# Patient Record
Sex: Female | Born: 1975 | Hispanic: No | Marital: Married | Smoking: Never smoker
Health system: Southern US, Community
[De-identification: ages and names within clinical notes are randomized; demographics above are authoritative.]

## PROBLEM LIST (undated history)

## (undated) DIAGNOSIS — E785 Hyperlipidemia, unspecified: Secondary | ICD-10-CM

## (undated) DIAGNOSIS — IMO0002 Reserved for concepts with insufficient information to code with codable children: Secondary | ICD-10-CM

## (undated) DIAGNOSIS — D72829 Elevated white blood cell count, unspecified: Secondary | ICD-10-CM

## (undated) DIAGNOSIS — R7989 Other specified abnormal findings of blood chemistry: Secondary | ICD-10-CM

## (undated) HISTORY — PX: WISDOM TOOTH EXTRACTION: SHX21

## (undated) HISTORY — DX: Elevated white blood cell count, unspecified: D72.829

## (undated) HISTORY — PX: TONSILLECTOMY AND ADENOIDECTOMY: SHX28

## (undated) HISTORY — DX: Reserved for concepts with insufficient information to code with codable children: IMO0002

## (undated) HISTORY — DX: Hyperlipidemia, unspecified: E78.5

## (undated) HISTORY — DX: Other specified abnormal findings of blood chemistry: R79.89

---

## 2003-01-02 ENCOUNTER — Other Ambulatory Visit: Admission: RE | Admit: 2003-01-02 | Discharge: 2003-01-02 | Payer: Self-pay | Admitting: Obstetrics and Gynecology

## 2004-01-12 ENCOUNTER — Other Ambulatory Visit: Admission: RE | Admit: 2004-01-12 | Discharge: 2004-01-12 | Payer: Self-pay | Admitting: Obstetrics and Gynecology

## 2004-11-14 DIAGNOSIS — R87619 Unspecified abnormal cytological findings in specimens from cervix uteri: Secondary | ICD-10-CM

## 2004-11-14 DIAGNOSIS — IMO0002 Reserved for concepts with insufficient information to code with codable children: Secondary | ICD-10-CM

## 2004-11-14 HISTORY — DX: Unspecified abnormal cytological findings in specimens from cervix uteri: R87.619

## 2004-11-14 HISTORY — DX: Reserved for concepts with insufficient information to code with codable children: IMO0002

## 2005-03-08 ENCOUNTER — Other Ambulatory Visit: Admission: RE | Admit: 2005-03-08 | Discharge: 2005-03-08 | Payer: Self-pay | Admitting: Obstetrics and Gynecology

## 2005-06-03 HISTORY — PX: CERVICAL BIOPSY  W/ LOOP ELECTRODE EXCISION: SUR135

## 2005-08-26 ENCOUNTER — Other Ambulatory Visit: Admission: RE | Admit: 2005-08-26 | Discharge: 2005-08-26 | Payer: Self-pay | Admitting: Obstetrics and Gynecology

## 2005-12-02 ENCOUNTER — Other Ambulatory Visit: Admission: RE | Admit: 2005-12-02 | Discharge: 2005-12-02 | Payer: Self-pay | Admitting: Obstetrics and Gynecology

## 2006-03-08 ENCOUNTER — Other Ambulatory Visit: Admission: RE | Admit: 2006-03-08 | Discharge: 2006-03-08 | Payer: Self-pay | Admitting: Obstetrics and Gynecology

## 2006-06-09 ENCOUNTER — Other Ambulatory Visit: Admission: RE | Admit: 2006-06-09 | Discharge: 2006-06-09 | Payer: Self-pay | Admitting: Obstetrics and Gynecology

## 2006-10-30 ENCOUNTER — Other Ambulatory Visit: Admission: RE | Admit: 2006-10-30 | Discharge: 2006-10-30 | Payer: Self-pay | Admitting: Obstetrics and Gynecology

## 2007-05-29 ENCOUNTER — Other Ambulatory Visit: Admission: RE | Admit: 2007-05-29 | Discharge: 2007-05-29 | Payer: Self-pay | Admitting: Obstetrics and Gynecology

## 2008-06-13 ENCOUNTER — Other Ambulatory Visit: Admission: RE | Admit: 2008-06-13 | Discharge: 2008-06-13 | Payer: Self-pay | Admitting: Obstetrics and Gynecology

## 2013-03-26 ENCOUNTER — Encounter: Payer: Self-pay | Admitting: Obstetrics and Gynecology

## 2013-07-12 ENCOUNTER — Encounter: Payer: Self-pay | Admitting: Obstetrics and Gynecology

## 2013-07-12 ENCOUNTER — Ambulatory Visit (INDEPENDENT_AMBULATORY_CARE_PROVIDER_SITE_OTHER): Payer: 59 | Admitting: Obstetrics and Gynecology

## 2013-07-12 VITALS — BP 106/66 | HR 81 | Resp 16 | Ht 62.0 in | Wt 154.0 lb

## 2013-07-12 DIAGNOSIS — Z01419 Encounter for gynecological examination (general) (routine) without abnormal findings: Secondary | ICD-10-CM

## 2013-07-12 DIAGNOSIS — Z0000 Encounter for general adult medical examination without abnormal findings: Secondary | ICD-10-CM

## 2013-07-12 LAB — POCT URINALYSIS DIPSTICK
Blood, UA: NEGATIVE
Glucose, UA: NEGATIVE
Ketones, UA: NEGATIVE
Urobilinogen, UA: NEGATIVE

## 2013-07-12 LAB — HEMOGLOBIN, FINGERSTICK: Hemoglobin, fingerstick: 12.6 g/dL (ref 12.0–16.0)

## 2013-07-12 MED ORDER — DROSPIRENONE-ETHINYL ESTRADIOL 3-0.03 MG PO TABS: 1.0000 | ORAL_TABLET | Freq: Every day | ORAL | Status: DC

## 2013-07-12 NOTE — Patient Instructions (Signed)

## 2013-07-12 NOTE — Progress Notes (Signed)
37 y.o.  Married   Caucasian   female   G1P0010   here for annual exam.  Likes OC's and wants to continue.    Patient's last menstrual period was 06/10/2013.          Sexually active: yes  The current method of family planning is OCP (estrogen/progesterone).    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 Diabetes Mother   . Thyroid disease Mother     hypo  . Cancer Father     carcinoma in sinus cavity    There are no active problems to display for this patient.   Past Medical History  Diagnosis Date  . 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 Cervical biopsy  w/ loop electrode excision  06/03/05    HGSIL CIN II/CINIII    Allergies: Penicillins  Current Outpatient Prescriptions  Medication Sig Dispense Refill  . Calcium-Magnesium-Vitamin D (CALCIUM 500 PO) Take by mouth daily.      . Multiple Vitamin (MULTI-VITAMIN DAILY PO) Take by mouth daily.      Marland Kitchen YASMIN 28 3-0.03 MG tablet        No current facility-administered medications for this visit.    ROS: Pertinent items are noted in HPI.  Social Hx:  Married, no children, works as a Transport planner  Exam:    BP 106/66  Pulse 81  Resp 16  Ht 5\' 2"  (1.575 m)  Wt 154 lb (69.854 kg)  BMI 28.16 kg/m2  LMP 07/28/2014GHt stable and Wt down 3 pounds from last yr   Wt Readings from Last 3 Encounters:  07/12/13 154 lb (69.854 kg)     Ht Readings from Last 3 Encounters:  07/12/13 5\' 2"  (1.575 m)    General appearance: alert, cooperative and appears stated age Head: Normocephalic, without  obvious abnormality, atraumatic Neck: no adenopathy, supple, symmetrical, trachea midline and thyroid not enlarged, symmetric, no tenderness/mass/nodules Lungs: clear to auscultation bilaterally Breasts: Inspection negative, No nipple retraction or dimpling, No nipple discharge or bleeding, No axillary or supraclavicular adenopathy, Normal to palpation without dominant masses Heart: regular rate and rhythm Abdomen: soft, non-tender; bowel sounds normal; no masses,  no organomegaly Extremities: extremities normal, atraumatic, no cyanosis or edema Skin: Skin color, texture, turgor normal. No rashes or lesions Lymph nodes: Cervical, supraclavicular, and axillary nodes normal. No abnormal inguinal nodes palpated Neurologic: Grossly normal   Pelvic: External genitalia:  no lesions              Urethra:  normal appearing urethra with no masses, tenderness or lesions              Bartholins and Skenes: normal                 Vagina: normal appearing vagina with normal color and discharge, no lesions              Cervix: normal appearance  Pap taken: yes        Bimanual Exam:  Uterus:  uterus is normal size, shape, consistency and nontender, af, mobile                                      Adnexa: normal adnexa in size, nontender and no masses                                      Rectovaginal: Confirms                                      Anus:  normal sphincter tone, no lesions  A: normal gyn exam, OC's     CIN 3 with + endocx margin 2006, nl paps since     P:     mammogram pap smear counseled on breast self exam, use and side effects of OCP's, adequate intake of calcium and vitamin D, diet and exercise return annually or prn   Discussed risk of DVT with yasmin - pt has tried others and didn't like them so elects to cont Yasmin.   An After Visit Summary was printed and given to the patient.

## 2013-07-17 ENCOUNTER — Ambulatory Visit: Payer: Self-pay | Admitting: Obstetrics and Gynecology

## 2013-07-17 LAB — IPS PAP TEST WITH HPV

## 2013-07-24 ENCOUNTER — Other Ambulatory Visit: Payer: Self-pay | Admitting: Obstetrics and Gynecology

## 2013-07-24 ENCOUNTER — Ambulatory Visit: Payer: Self-pay | Admitting: Obstetrics and Gynecology

## 2014-07-16 ENCOUNTER — Other Ambulatory Visit: Payer: Self-pay | Admitting: Obstetrics and Gynecology

## 2014-07-16 ENCOUNTER — Ambulatory Visit (INDEPENDENT_AMBULATORY_CARE_PROVIDER_SITE_OTHER): Payer: 59 | Admitting: Obstetrics and Gynecology

## 2014-07-16 ENCOUNTER — Encounter: Payer: Self-pay | Admitting: Obstetrics and Gynecology

## 2014-07-16 VITALS — BP 96/70 | HR 72 | Resp 18 | Ht 62.5 in | Wt 141.0 lb

## 2014-07-16 DIAGNOSIS — D259 Leiomyoma of uterus, unspecified: Secondary | ICD-10-CM | POA: Insufficient documentation

## 2014-07-16 DIAGNOSIS — Z01419 Encounter for gynecological examination (general) (routine) without abnormal findings: Secondary | ICD-10-CM

## 2014-07-16 DIAGNOSIS — Z0000 Encounter for general adult medical examination without abnormal findings: Secondary | ICD-10-CM

## 2014-07-16 DIAGNOSIS — N852 Hypertrophy of uterus: Secondary | ICD-10-CM

## 2014-07-16 LAB — CBC
HEMATOCRIT: 39.3 % (ref 36.0–46.0)
HEMOGLOBIN: 13.2 g/dL (ref 12.0–15.0)
MCH: 29.5 pg (ref 26.0–34.0)
MCHC: 33.6 g/dL (ref 30.0–36.0)
MCV: 87.7 fL (ref 78.0–100.0)
Platelets: 310 10*3/uL (ref 150–400)
RBC: 4.48 MIL/uL (ref 3.87–5.11)
RDW: 14.2 % (ref 11.5–15.5)
WBC: 9.1 10*3/uL (ref 4.0–10.5)

## 2014-07-16 LAB — POCT URINALYSIS DIPSTICK
Bilirubin, UA: NEGATIVE
Blood, UA: NEGATIVE
Glucose, UA: NEGATIVE
KETONES UA: NEGATIVE
Leukocytes, UA: NEGATIVE
Nitrite, UA: NEGATIVE
PH UA: 6
PROTEIN UA: NEGATIVE
Urobilinogen, UA: NEGATIVE

## 2014-07-16 LAB — POCT URINE PREGNANCY: PREG TEST UR: NEGATIVE

## 2014-07-16 LAB — HEMOGLOBIN, FINGERSTICK: Hemoglobin, fingerstick: 13.1 g/dL (ref 12.0–16.0)

## 2014-07-16 MED ORDER — DROSPIRENONE-ETHINYL ESTRADIOL 3-0.03 MG PO TABS: 1.0000 | ORAL_TABLET | Freq: Every day | ORAL | Status: DC

## 2014-07-16 NOTE — Patient Instructions (Addendum)
EXERCISE AND DIET:  We recommended that you start or continue a regular exercise program for good health. Regular exercise means any activity that makes your heart beat faster and makes you sweat.  We recommend exercising at least 30 minutes per day at least 3 days a week, preferably 4 or 5.  We also recommend a diet low in fat and sugar.  Inactivity, poor dietary choices and obesity can cause diabetes, heart attack, stroke, and kidney damage, among others.    ALCOHOL AND SMOKING:  Women should limit their alcohol intake to no more than 7 drinks/beers/glasses of wine (combined, not each!) per week. Moderation of alcohol intake to this level decreases your risk of breast cancer and liver damage. And of course, no recreational drugs are part of a healthy lifestyle.  And absolutely no smoking or even second hand smoke. Most people know smoking can cause heart and lung diseases, but did you know it also contributes to weakening of your bones? Aging of your skin?  Yellowing of your teeth and nails?  CALCIUM AND VITAMIN D:  Adequate intake of calcium and Vitamin D are recommended.  The recommendations for exact amounts of these supplements seem to change often, but generally speaking 600 mg of calcium (either carbonate or citrate) and 800 units of Vitamin D per day seems prudent. Certain women may benefit from higher intake of Vitamin D.  If you are among these women, your doctor will have told you during your visit.    PAP SMEARS:  Pap smears, to check for cervical cancer or precancers,  have traditionally been done yearly, although recent scientific advances have shown that most women can have pap smears less often.  However, every woman still should have a physical exam from her gynecologist every year. It will include a breast check, inspection of the vulva and vagina to check for abnormal growths or skin changes, a visual exam of the cervix, and then an exam to evaluate the size and shape of the uterus and  ovaries.  And after 40 years of age, a rectal exam is indicated to check for rectal cancers. We will also provide age appropriate advice regarding health maintenance, like when you should have certain vaccines, screening for sexually transmitted diseases, bone density testing, colonoscopy, mammograms, etc.   MAMMOGRAMS:  All women over 40 years old should have a yearly mammogram. Many facilities now offer a "3D" mammogram, which may cost around $50 extra out of pocket. If possible,  we recommend you accept the option to have the 3D mammogram performed.  It both reduces the number of women who will be called back for extra views which then turn out to be normal, and it is better than the routine mammogram at detecting truly abnormal areas.    COLONOSCOPY:  Colonoscopy to screen for colon cancer is recommended for all women at age 50.  We know, you hate the idea of the prep.  We agree, BUT, having colon cancer and not knowing it is worse!!  Colon cancer so often starts as a polyp that can be seen and removed at colonscopy, which can quite literally save your life!  And if your first colonoscopy is normal and you have no family history of colon cancer, most women don't have to have it again for 10 years.  Once every ten years, you can do something that may end up saving your life, right?  We will be happy to help you get it scheduled when you are ready.    Be sure to check your insurance coverage so you understand how much it will cost.  It may be covered as a preventative service at no cost, but you should check your particular policy.     Fibroids Fibroids are lumps (tumors) that can occur any place in a woman's body. These lumps are not cancerous. Fibroids vary in size, weight, and where they grow. HOME CARE  Do not take aspirin.  Write down the number of pads or tampons you use during your period. Tell your doctor. This can help determine the best treatment for you. GET HELP RIGHT AWAY IF:  You have  pain in your lower belly (abdomen) that is not helped with medicine.  You have cramps that are not helped with medicine.  You have more bleeding between or during your period.  You feel lightheaded or pass out (faint).  Your lower belly pain gets worse. MAKE SURE YOU:  Understand these instructions.  Will watch your condition.  Will get help right away if you are not doing well or get worse. Document Released: 12/03/2010 Document Revised: 01/23/2012 Document Reviewed: 12/03/2010 [facility] Patient Information 2015 [place], [place]. This information is not intended to replace advice given to you by your health care provider. Make sure you discuss any questions you have with your health care provider.

## 2014-07-16 NOTE — Progress Notes (Signed)
GYNECOLOGY VISIT  PCP: No PCP  Referring provider:   HPI: 38 y.o.   Unknown  Caucasian  female   G1P0010 with Patient's last menstrual period was 06/30/2014.   here for   Annual Gynecological Examination    Uses Yasmin name brand.  Menses normal on Yasmin.  Had heavy menses prior to this.   Working with a Clinical research associate to loose weight.   Hgb:  13.1 Urine: Neg   GYNECOLOGIC HISTORY: Patient's last menstrual period was 06/30/2014. Sexually active:  yes Partner preference: Female Contraception:   OCP - Yasmin Menopausal hormone therapy: None DES exposure:  None   Blood transfusions: No   Sexually transmitted diseases:  HPV+  GYN procedures and prior surgeries: LEEP - CIN 3 2006 Last mammogram: N/A                Last pap and high risk HPV testing: 06/2013 Neg. HR HPV neg   History of abnormal pap smear:  Yes, HSIL (ASC-H) +HPV 2006   OB History   Grav Para Term Preterm Abortions TAB SAB Ect Mult Living   1 0   1     0       LIFESTYLE: Exercise:  Yes, cardio, weights daily              OTHER HEALTH MAINTENANCE: Tetanus/TDap: 2004- Pt denies one today HPV: No Influenza: No    Bone density: No Colonoscopy: No  Cholesterol check: 2009  Family History  Problem Relation Age of Onset  . Diabetes Mother   . Thyroid disease Mother     hypo  . Cancer Father     carcinoma in sinus cavity    There are no active problems to display for this patient.  Past Medical History  Diagnosis Date  . 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 Cervical biopsy  w/ loop electrode excision  06/03/05    HGSIL CIN II/CINIII    ALLERGIES: Penicillins  Current Outpatient Prescriptions  Medication Sig Dispense Refill  . Calcium-Magnesium-Vitamin D (CALCIUM 500 PO) Take by mouth daily.      . drospirenone-ethinyl estradiol (YASMIN 28) 3-0.03 MG tablet Take 1 tablet by mouth daily.  1 Package  12  . Multiple Vitamin  (MULTI-VITAMIN DAILY PO) Take by mouth daily.       No current facility-administered medications for this visit.     ROS:  Pertinent items are noted in HPI.  History   Social History  . Marital Status: Unknown    Spouse Name: N/A    Number of Children: N/A  . Years of Education: N/A   Occupational History  . Not on file.   Social History Main Topics  . Smoking status: Never Smoker   . Smokeless tobacco: Never Used  . Alcohol Use: 1.2 oz/week    2 Cans of beer per week  . Drug Use: No  . Sexual Activity: Yes    Partners: Male    Birth Control/ Protection: Pill     Comment: Yasmin   Other Topics Concern  . Not on file   Social History Narrative  . No narrative on file    PHYSICAL EXAMINATION:    BP 96/70  Pulse 72  Resp 18  Ht 5' 2.5" (1.588 m)  Wt 141 lb (63.957 kg)  BMI 25.36 kg/m2  LMP 06/30/2014   Wt Readings from Last 3 Encounters:  07/16/14  141 lb (63.957 kg)  07/12/13 154 lb (69.854 kg)     Ht Readings from Last 3 Encounters:  07/16/14 5' 2.5" (1.588 m)  07/12/13 5\' 2"  (1.575 m)    General appearance: alert, cooperative and appears stated age Head: Normocephalic, without obvious abnormality, atraumatic Neck: no adenopathy, supple, symmetrical, trachea midline and thyroid not enlarged, symmetric, no tenderness/mass/nodules Lungs: clear to auscultation bilaterally Breasts: Inspection negative, No nipple retraction or dimpling, No nipple discharge or bleeding, No axillary or supraclavicular adenopathy, Normal to palpation without dominant masses Heart: regular rate and rhythm Abdomen: soft, non-tender; no masses,  no organomegaly Extremities: extremities normal, atraumatic, no cyanosis or edema Skin: Skin color, texture, turgor normal. No rashes or lesions Lymph nodes: Cervical, supraclavicular, and axillary nodes normal. No abnormal inguinal nodes palpated Neurologic: Grossly normal  Pelvic: External genitalia:  no lesions              Urethra:   normal appearing urethra with no masses, tenderness or lesions              Bartholins and Skenes: normal                 Vagina: normal appearing vagina with normal color and discharge, no lesions              Cervix: normal appearance              Pap and high risk HPV testing done: Yes.          Bimanual Exam:  Uterus:   14 week size and nontender.  Uterus feels broad based.                                       Adnexa: normal adnexa in size, nontender and no masses                                      Rectovaginal:  Yes.                                        Confirms above.                                       Anus:  normal sphincter tone, no lesions  UPT negative  ASSESSMENT  Normal gynecologic exam. Uterine enlargement.  Fibroids on exam.  History of LEEP for CIN 3.  PLAN  Mammogram recommended yearly starting at age 72. Pap smear and high risk HPV testing as above. Counseled on self breast exam, Calcium and vitamin D intake, exercise. See lab orders: Yes.  Routine labs. Preliminary discussion regarding fibroids.  Written information regarding fibroids as well. Return for pelvic ultrasound.  Refill on OCPs - Yasmin for 12 months.  Return annually or prn   An After Visit Summary was printed and given to the patient.

## 2014-07-17 ENCOUNTER — Telehealth: Payer: Self-pay | Admitting: Obstetrics and Gynecology

## 2014-07-17 LAB — LIPID PANEL
CHOL/HDL RATIO: 3.5 ratio
Cholesterol: 150 mg/dL (ref 0–200)
HDL: 43 mg/dL (ref >39)
LDL CALC: 72 mg/dL (ref 0–99)
Triglycerides: 173 mg/dL — ABNORMAL HIGH (ref <150)
VLDL: 35 mg/dL (ref 0–40)

## 2014-07-17 LAB — COMPREHENSIVE METABOLIC PANEL
ALT: 16 U/L (ref 0–35)
AST: 15 U/L (ref 0–37)
Albumin: 4.2 g/dL (ref 3.5–5.2)
Alkaline Phosphatase: 71 U/L (ref 39–117)
BUN: 10 mg/dL (ref 6–23)
CHLORIDE: 103 meq/L (ref 96–112)
CO2: 26 meq/L (ref 19–32)
CREATININE: 0.57 mg/dL (ref 0.50–1.10)
Calcium: 9.2 mg/dL (ref 8.4–10.5)
Glucose, Bld: 106 mg/dL — ABNORMAL HIGH (ref 70–99)
Potassium: 3.7 mEq/L (ref 3.5–5.3)
Sodium: 137 mEq/L (ref 135–145)
Total Bilirubin: 0.3 mg/dL (ref 0.2–1.2)
Total Protein: 6.8 g/dL (ref 6.0–8.3)

## 2014-07-17 LAB — TSH: TSH: 4.512 u[IU]/mL — ABNORMAL HIGH (ref 0.350–4.500)

## 2014-07-17 NOTE — Telephone Encounter (Signed)
Patient called to check on pre-cert for PUS. Advised patient that per benefit quote received, she will be responsible for $465.96 when she comes in for PUS. Patient agreeable.  Scheduled PUS. Advised patient of 72 hour cancellation policy and $161 cancellation fee. Patient agreeable.

## 2014-07-18 LAB — THYROID PROFILE - CHCC
FREE THYROXINE INDEX: 2.2 (ref 1.4–3.8)
T3 Uptake: 21 % — ABNORMAL LOW (ref 22.0–35.0)
T4, Total: 10.6 ug/dL (ref 4.5–12.0)

## 2014-07-22 ENCOUNTER — Other Ambulatory Visit: Payer: Self-pay | Admitting: Obstetrics and Gynecology

## 2014-07-22 DIAGNOSIS — R7989 Other specified abnormal findings of blood chemistry: Secondary | ICD-10-CM

## 2014-07-23 LAB — IPS PAP TEST WITH HPV

## 2014-07-24 ENCOUNTER — Other Ambulatory Visit: Payer: Self-pay | Admitting: Obstetrics and Gynecology

## 2014-07-24 ENCOUNTER — Encounter: Payer: Self-pay | Admitting: Obstetrics and Gynecology

## 2014-07-24 ENCOUNTER — Ambulatory Visit (INDEPENDENT_AMBULATORY_CARE_PROVIDER_SITE_OTHER): Payer: 59 | Admitting: Obstetrics and Gynecology

## 2014-07-24 ENCOUNTER — Ambulatory Visit (INDEPENDENT_AMBULATORY_CARE_PROVIDER_SITE_OTHER): Payer: 59

## 2014-07-24 VITALS — BP 120/82 | HR 72 | Wt 139.0 lb

## 2014-07-24 DIAGNOSIS — N852 Hypertrophy of uterus: Secondary | ICD-10-CM

## 2014-07-24 DIAGNOSIS — D251 Intramural leiomyoma of uterus: Secondary | ICD-10-CM

## 2014-07-24 NOTE — Patient Instructions (Addendum)
Hysterectomy Information  A hysterectomy is a surgery in which your uterus is removed. This surgery may be done to treat various medical problems. After the surgery, you will no longer have menstrual periods. The surgery will also make you unable to become pregnant (sterile). The fallopian tubes and ovaries can be removed (bilateral salpingo-oophorectomy) during this surgery as well.  REASONS FOR A HYSTERECTOMY  Persistent, abnormal bleeding.  Lasting (chronic) pelvic pain or infection.  The lining of the uterus (endometrium) starts growing outside the uterus (endometriosis).  The endometrium starts growing in the muscle of the uterus (adenomyosis).  The uterus falls down into the vagina (pelvic organ prolapse).  Noncancerous growths in the uterus (uterine fibroids) that cause symptoms.  Precancerous cells.  Cervical cancer or uterine cancer. TYPES OF HYSTERECTOMIES  Supracervical hysterectomy--In this type, the top part of the uterus is removed, but not the cervix.  Total hysterectomy--The uterus and cervix are removed.  Radical hysterectomy--The uterus, the cervix, and the fibrous tissue that holds the uterus in place in the pelvis (parametrium) are removed. WAYS A HYSTERECTOMY CAN BE PERFORMED  Abdominal hysterectomy--A large surgical cut (incision) is made in the abdomen. The uterus is removed through this incision.  Vaginal hysterectomy--An incision is made in the vagina. The uterus is removed through this incision. There are no abdominal incisions.  Conventional laparoscopic hysterectomy--Three or four small incisions are made in the abdomen. A thin, lighted tube with a camera (laparoscope) is inserted into one of the incisions. Other tools are put through the other incisions. The uterus is cut into small pieces. The small pieces are removed through the incisions, or they are removed through the vagina.  Laparoscopically assisted vaginal hysterectomy (LAVH)--Three or four  small incisions are made in the abdomen. Part of the surgery is performed laparoscopically and part vaginally. The uterus is removed through the vagina.  Robot-assisted laparoscopic hysterectomy--A laparoscope and other tools are inserted into 3 or 4 small incisions in the abdomen. A computer-controlled device is used to give the surgeon a 3D image and to help control the surgical instruments. This allows for more precise movements of surgical instruments. The uterus is cut into small pieces and removed through the incisions or removed through the vagina. RISKS AND COMPLICATIONS  Possible complications associated with this procedure include:  Bleeding and risk of blood transfusion. Tell your health care provider if you do not want to receive any blood products.  Blood clots in the legs or lung.  Infection.  Injury to surrounding organs.  Problems or side effects related to anesthesia.  Conversion to an abdominal hysterectomy from one of the other techniques. WHAT TO EXPECT AFTER A HYSTERECTOMY  You will be given pain medicine.  You will need to have someone with you for the first 3-5 days after you go home.  You will need to follow up with your surgeon in 2-4 weeks after surgery to evaluate your progress.  You may have early menopause symptoms such as hot flashes, night sweats, and insomnia.  If you had a hysterectomy for a problem that was not cancer or not a condition that could lead to cancer, then you no longer need Pap tests. However, even if you no longer need a Pap test, a regular exam is a good idea to make sure no other problems are starting. Document Released: 04/26/2001 Document Revised: 08/21/2013 Document Reviewed: 07/08/2013 [facility] Patient Information 2015 [place], [place]. This information is not intended to replace advice given to you by your health care  provider. Make sure you discuss any questions you have with your health care provider. Fibroids Fibroids are lumps  (tumors) that can occur any place in a woman's body. These lumps are not cancerous. Fibroids vary in size, weight, and where they grow. HOME CARE  Do not take aspirin.  Write down the number of pads or tampons you use during your period. Tell your doctor. This can help determine the best treatment for you. GET HELP RIGHT AWAY IF:  You have pain in your lower belly (abdomen) that is not helped with medicine.  You have cramps that are not helped with medicine.  You have more bleeding between or during your period.  You feel lightheaded or pass out (faint).  Your lower belly pain gets worse. MAKE SURE YOU:  Understand these instructions.  Will watch your condition.  Will get help right away if you are not doing well or get worse. Document Released: 12/03/2010 Document Revised: 01/23/2012 Document Reviewed: 12/03/2010 [employer] Patient Information 2015 [employer]. This information is not intended to replace advice given to you by your health care provider. Make sure you discuss any questions you have with your health care provider.  

## 2014-07-24 NOTE — Progress Notes (Signed)
Subjective  Patient is her for pelvic ultrasound due to enlarged uterus noted on routine examination.  Husband present for the discussion today.   WIth jumping feels urinary urgency.  Menses are generally heavy but does not interfere with her life.  On OCPs.   Declines future childbearing.   History of LEEP for cervical dysplasia.  Recent pap normal.   Hgb 13.2 at last visit.  TSH 5.12, T4 10.6.  Objective  See ultrasound report and images - reviewed with patient. Uterus with solitary 8.4 cm anterior intramural fibroid.   EMS 6.11 mm and distorted by the fibroid.  Ovaries normal.  No free fluid.     Assessment  Large uterine fibroid.   Plan  Discussion about uterine fibroids - life history, small risk of sarcomatous cells, and treatment options.  I discussed continuing OCPs and follow up ultrasound in 3 months, uterine artery embolization, and laparoscopic hysterectomy with morcellation versus total abdominal hysterectomy - both accompanied by bilateral salpingectomy.  I discussed Depo Lupron as a mechanism to shrink fibroids prior to proceeding with laparoscopic hysterectomy.  Literature provided for the patient about hysterectomy, which is the treatment that she is most considering at this time.  She may be interested to proceed before the end of this year.   25 minutes face to face time of which over 50% was spent in counseling.   After visit summary to patient.    I provided literature for the patient

## 2014-07-25 ENCOUNTER — Telehealth: Payer: Self-pay | Admitting: Obstetrics and Gynecology

## 2014-07-25 NOTE — Telephone Encounter (Signed)
Return call to Sabrina. °

## 2014-07-25 NOTE — Telephone Encounter (Signed)
Spoke with patient. Advised that per benefit quote received, she will be responsible for $2572.18 for the surgeons portion of her surgery. Advised that per our office policy, this payment will be due in full at least 2 weeks prior to her scheduled surgery date. Patient agreeable. Patient asked about hospital charges. I advised that she can contact the pre-services center at [phone] with cpt code [phone] and ask for a quote. Patient agreeable and hopes to schedule mid-late October.

## 2014-07-25 NOTE — Telephone Encounter (Signed)
Left message for patient to call back. Need to go over surgery benefits. °

## 2014-07-28 NOTE — Telephone Encounter (Signed)
VM received from [place] at [facility] [phone] requesting clinical info for precert within 2 hours. Call back to [facility], advised this is an unrealistic time expectation since this form must be completed by MD and we will send as soon as we are able to complete.

## 2014-07-29 ENCOUNTER — Other Ambulatory Visit: Payer: Self-pay

## 2014-07-29 NOTE — Telephone Encounter (Signed)
Last AEX: 07/16/14 Last refill:07/16/14 ! Pack X 11 Current AEX:07/24/15  Pt was given new rx on 07/16/14

## 2014-07-29 NOTE — Telephone Encounter (Signed)
Ref # [id number]   [facility] callin sabrina in regards to surgery precert.

## 2014-07-31 ENCOUNTER — Telehealth: Payer: Self-pay | Admitting: Obstetrics and Gynecology

## 2014-07-31 NOTE — Telephone Encounter (Signed)
Call to patient. Given update that insurance company requires precert for surgery and the documentation has now been completed and submitted.   Discussed date options. Patient planning on November due to husbands work schedule. Discussed available dates in November and advised surgery dates fill quickly at end of the year. Patient to consider options for dates and weigh benefit of waiting for new insurance plan that begins on 10-14-14. She will call me back once she has made decision.  Routing to provider for final review. Patient agreeable to disposition. Will close encounter

## 2014-07-31 NOTE — Telephone Encounter (Signed)
See previous phone note. Patient requests surgery date of 10-21-14. Advised will need to call with new insurance info as soon as available so that new precert can be initiated. Will call her back once surgery posted. Case request sent to hospital.

## 2014-07-31 NOTE — Telephone Encounter (Signed)
Per Glennon Hamilton said to open a new phone note for this patient as the previous one was closed in error.

## 2014-08-08 NOTE — Telephone Encounter (Signed)
Case is posted for 10-21-14 as patient requested pending new insurance information and precertification.  Routing to provider for final review. Patient agreeable to disposition. Will close encounter

## 2014-08-18 ENCOUNTER — Telehealth: Payer: Self-pay | Admitting: Obstetrics and Gynecology

## 2014-08-18 NOTE — Telephone Encounter (Signed)
Nichole Mcgee, routing this to you for records and precert information

## 2014-08-18 NOTE — Telephone Encounter (Signed)
Pt says she received a letter from her insurance company requesting records before she schedule surgery.

## 2014-08-18 NOTE — Telephone Encounter (Signed)
Returned call to patient. Patient states that the letter she received is from [facility] and is dated 09.15.2015 requesting records for a surgery that she is considering. I advised patient that we sent records to [facility] on 09.17.2015.  Also advised patient that in order to prevent having to reschedule her surgery, we will need to have her new insurance plan information no later than Sep 29, 2014. I explained that pre-payment for surgery is required at least 2 weeks prior to the scheduled surgery date and that we need her new coverage information in a timely manner so that we can complete pre-cert process and provide her with information regarding her out of pocket expectation.   Patient states that she will speak with her employer and will call back with details regarding her new plan ASAP.

## 2014-08-21 NOTE — Telephone Encounter (Signed)
Call to patient. Patient states that she will be renewing policy with the same insurance company. Benefits should remain the same.

## 2014-08-21 NOTE — Telephone Encounter (Signed)
Pt states she is returning a call to sabrina

## 2014-08-22 ENCOUNTER — Telehealth: Payer: Self-pay | Admitting: Obstetrics and Gynecology

## 2014-08-22 NOTE — Telephone Encounter (Signed)
Per Eddie Dibbles, we cannot initiate another request for the patients surgery because the original request for patients surgery had been denied due to not having clinical data. He confirmed that the clinical data has now been received, but that we must go through the appeal process in order to have the surgery authorized for date of service 12.08.2015. [id number]

## 2014-08-27 NOTE — Telephone Encounter (Signed)
Letter written by administration and sent to insurance company on 08-26-14. (reviewed by Dr Sabra Heck while Dr Quincy Simmonds out of office).  Routing to provider for final review. Patient agreeable to disposition. Will close encounter

## 2014-09-03 ENCOUNTER — Other Ambulatory Visit (INDEPENDENT_AMBULATORY_CARE_PROVIDER_SITE_OTHER): Payer: 59

## 2014-09-03 DIAGNOSIS — R7989 Other specified abnormal findings of blood chemistry: Secondary | ICD-10-CM

## 2014-09-03 DIAGNOSIS — R946 Abnormal results of thyroid function studies: Secondary | ICD-10-CM

## 2014-09-03 LAB — THYROID PANEL WITH TSH
Free Thyroxine Index: 2.4 (ref 1.4–3.8)
T3 Uptake: 27 % (ref 22.0–35.0)
T4, Total: 8.9 ug/dL (ref 4.5–12.0)
TSH: 1.898 u[IU]/mL (ref 0.350–4.500)

## 2014-09-15 ENCOUNTER — Encounter: Payer: Self-pay | Admitting: Obstetrics and Gynecology

## 2014-09-23 ENCOUNTER — Telehealth: Payer: Self-pay | Admitting: *Deleted

## 2014-09-23 NOTE — Telephone Encounter (Signed)
Call to patient. Surgery date 10-21-14 at 730 at [facility] for TAH/ Bilateral Salpingectomy. Surgery instruction sheet reviewed with patient and printed copy mailed. All surgical appointments scheduled. Will she need bowel prep?

## 2014-09-23 NOTE — Telephone Encounter (Signed)
I recommend doing a Magnesium Citrate bowel prep the day before surgery and starting this at noon. Clear liquids only after starts bowel prep.  Thanks.

## 2014-09-23 NOTE — Telephone Encounter (Signed)
Patient notified and instructions given.  Encounter closed.

## 2014-09-29 ENCOUNTER — Ambulatory Visit (INDEPENDENT_AMBULATORY_CARE_PROVIDER_SITE_OTHER): Payer: 59 | Admitting: Obstetrics and Gynecology

## 2014-09-29 ENCOUNTER — Telehealth: Payer: Self-pay | Admitting: Obstetrics and Gynecology

## 2014-09-29 ENCOUNTER — Encounter: Payer: Self-pay | Admitting: Obstetrics and Gynecology

## 2014-09-29 VITALS — BP 110/70 | HR 70 | Ht 62.5 in | Wt 142.8 lb

## 2014-09-29 DIAGNOSIS — D259 Leiomyoma of uterus, unspecified: Secondary | ICD-10-CM

## 2014-09-29 NOTE — Patient Instructions (Signed)
Please take the bottle of magnesium citrate the day before surgery, starting at 12:00 noon.  You may drink only clear liquids after that time.

## 2014-09-29 NOTE — Progress Notes (Signed)
Patient ID: Nichole Mcgee, female   DOB: 1976-08-06, 38 y.o.   MRN: [id number] GYNECOLOGY  VISIT   HPI: 38 y.o.   Married  Caucasian  female   G1P0010 with Patient's last menstrual period was 09/15/2014 (approximate).   here to discuss surgery.   Planning on hysterectomy on 10/21/14 for uterine fibroid.  Currently on Yasmin.  No history of painful menstruation prior to taking OCPs. Declines future childbearing.   History of LEEP for cervical dysplasia.  Recent pap normal.   Labs 07/16/14:  Hgb 13.2. TSH 5.12, T4 10.6.  Ultrasound 07/24/14 -  Uterus with solitary 8.4 cm anterior intramural fibroid.  EMS 6.11 mm and distorted by the fibroid.  Ovaries normal.  No free fluid.   GYNECOLOGIC HISTORY: Patient's last menstrual period was 09/15/2014 (approximate). Contraception:  OCP's--Yasmin  Menopausal hormone therapy: n/a Sexually transmitted diseases: HPV+  GYN procedures and prior surgeries: LEEP - CIN 3 2006 DES exposure: None  Blood transfusions: No  Last mammogram: N/A  Last pap and high risk HPV testing: 07/16/14 - Neg. HR HPV neg  History of abnormal pap smear: Yes, HSIL (ASC-H) +HPV 2006   OB History    Gravida Para Term Preterm AB TAB SAB Ectopic Multiple Living   1 0   1     0         Patient Active Problem List   Diagnosis Date Noted  . Uterine fibroid 07/16/2014    Past Medical History  Diagnosis Date  . 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 Cervical biopsy  w/ loop electrode excision  06/03/05    HGSIL CIN II/CINIII  . Wisdom tooth extraction      Current Outpatient Prescriptions  Medication Sig Dispense Refill  . Calcium-Magnesium-Vitamin D (CALCIUM 500 PO) Take by mouth daily.    . drospirenone-ethinyl estradiol (YASMIN 28) 3-0.03 MG tablet Take 1 tablet by mouth daily. 1 Package 11  . Multiple Vitamin (MULTI-VITAMIN DAILY PO) Take by mouth daily.     No  current facility-administered medications for this visit.     ALLERGIES: Penicillins  Family History  Problem Relation Age of Onset  . Diabetes Mother     AODM  . Thyroid disease Mother     hypo  . Cancer Father     carcinoma in sinus cavity  . Diabetes Maternal Grandmother     AODM    History   Social History  . Marital Status: Unknown    Spouse Name: N/A    Number of Children: N/A  . Years of Education: N/A   Occupational History  . Not on file.   Social History Main Topics  . Smoking status: Never Smoker   . Smokeless tobacco: Never Used  . Alcohol Use: 1.2 oz/week    2 Cans of beer per week  . Drug Use: No  . Sexual Activity:    Partners: Male    Birth Control/ Protection: Pill     Comment: Yasmin   Other Topics Concern  . Not on file   Social History Narrative    ROS:  Pertinent items are noted in HPI.  PHYSICAL EXAMINATION:    BP 110/70 mmHg  Pulse 70  Ht 5' 2.5" (1.588 m)  Wt 142 lb 12.8 oz (64.774 kg)  BMI 25.69 kg/m2  LMP 09/15/2014 (Approximate)     General appearance: alert, cooperative and appears stated age Lungs: clear  to auscultation bilaterally Heart: regular rate and rhythm Abdomen: uterus to umbilicus - 2 cm, soft, non-tender.  No abnormal inguinal nodes palpated  Pelvic: External genitalia:  no lesions              Urethra:  normal appearing urethra with no masses, tenderness or lesions              Bartholins and Skenes: normal                 Vagina: normal appearing vagina with normal color and discharge, no lesions              Cervix: consistent with prior LEEP.                   Bimanual Exam:  Uterus:  uterus is 16 week size and out of the pelvis, nontender                                      Adnexa: normal adnexa in size, nontender and no masses                                       ASSESSMENT  Uterine fibroid.  Enlarging.  Declines future childbearing.  History of LEEP.  Last pap normal.   PLAN  Plan is for  total abdominal hysterectomy with bilateral salpingectomy, possible bilateral salpingo-oophorectomy if endometriosis or abnormality detected.  Reviewed benefits, risks, and alternatives to surgery.  Risks include but are not limited to bleeding, infection, damage to surrounding organs, reaction to anesthesia, pneumonia, hernias, neuropathies, DVT, PE, death, and need for reoperation.  Patient wishes to proceed.  She will do a magnesium citrate bowel preparation.  Can continue OCPs until after surgery is done.     An After Visit Summary was printed and given to the patient.  __25____ minutes face to face time of which over 50% was spent in counseling.

## 2014-09-29 NOTE — Telephone Encounter (Signed)
MAILED SURGERY PAYMENT LETTER  September 29, 2014   Dear Ms. Nichole Mcgee,  Your surgery is scheduled for October 21, 2014.  Upon requesting authorization for your surgery, your insurance company has informed us that they will cover 80% of the charges after a $2500.00 deductible, and you will be responsible to pay approximately $2572.18_.  It is our office policy that this amount is paid in full two weeks prior to your surgery. Your payment is due October 07, 2014.  If there is a balance due after your insurance company pays their portion, we will send you a bill.  If there is a refund due to you, we will send you a check within one month.  Payment may be made by cash, check, Visa, MasterCard or American Express.  If payment is not made two weeks prior to your surgery, we will have to reschedule your surgery.  The above fee includes only our fee for the surgery and does not include charges you may have from the facility, anesthesia or pathology.  If you have any questions, please call us at [phone].

## 2014-10-13 NOTE — Telephone Encounter (Signed)
Pt  Wants to talk with Janett Billow about a payment she made.

## 2014-10-15 ENCOUNTER — Encounter (HOSPITAL_COMMUNITY)
Admission: RE | Admit: 2014-10-15 | Discharge: 2014-10-15 | Disposition: A | Discharge status: Home / Self Care | Payer: 59 | Source: Ambulatory Visit | Attending: Obstetrics and Gynecology | Admitting: Obstetrics and Gynecology

## 2014-10-15 ENCOUNTER — Encounter (HOSPITAL_COMMUNITY): Payer: Self-pay

## 2014-10-15 DIAGNOSIS — Z01812 Encounter for preprocedural laboratory examination: Secondary | ICD-10-CM | POA: Insufficient documentation

## 2014-10-15 LAB — CBC
HEMATOCRIT: 39 % (ref 36.0–46.0)
Hemoglobin: 13.9 g/dL (ref 12.0–15.0)
MCH: 30.8 pg (ref 26.0–34.0)
MCHC: 35.6 g/dL (ref 30.0–36.0)
MCV: 86.3 fL (ref 78.0–100.0)
PLATELETS: 319 10*3/uL (ref 150–400)
RBC: 4.52 MIL/uL (ref 3.87–5.11)
RDW: 13.7 % (ref 11.5–15.5)
WBC: 8.5 10*3/uL (ref 4.0–10.5)

## 2014-10-15 NOTE — Patient Instructions (Addendum)
   Your procedure is scheduled on:  Tuesday, Dec 8  Enter through the [employer] [facility] at: 6 AM Pick up the phone at the desk and dial [phone] and inform us of your arrival.  Please call this number if you have any problems the morning of surgery: [phone]  Remember: Do not eat or drink after midnight: Monday Take these medicines the morning of surgery with a SIP OF WATER:  None  Do not wear jewelry, make-up, or FINGER nail polish No metal in your hair or on your body. Do not wear lotions, powders, perfumes.  You may wear deodorant.  Do not bring valuables to the hospital. Contacts, dentures or bridgework may not be worn into surgery.  Leave suitcase in the car. After Surgery it may be brought to your room. For patients being admitted to the hospital, checkout time is 11:00am the day of discharge.  Home with husband Leory Plowman cell [phone]

## 2014-10-20 MED ORDER — GENTAMICIN SULFATE 40 MG/ML IJ SOLN
INTRAVENOUS | Status: AC
  Administered 2014-10-21: 114 mL via INTRAVENOUS
  Filled 2014-10-20: qty 8

## 2014-10-21 ENCOUNTER — Inpatient Hospital Stay (HOSPITAL_COMMUNITY)
Admission: RE | Admit: 2014-10-21 | Discharge: 2014-10-23 | DRG: 743 | Disposition: A | Discharge status: Home / Self Care | Payer: 59 | Source: Ambulatory Visit | Attending: Obstetrics and Gynecology | Admitting: Obstetrics and Gynecology

## 2014-10-21 ENCOUNTER — Inpatient Hospital Stay (HOSPITAL_COMMUNITY): Payer: 59 | Admitting: Anesthesiology

## 2014-10-21 ENCOUNTER — Encounter (HOSPITAL_COMMUNITY): Payer: Self-pay | Admitting: Anesthesiology

## 2014-10-21 ENCOUNTER — Encounter (HOSPITAL_COMMUNITY)
Admission: RE | Disposition: A | Discharge status: Home / Self Care | Payer: Self-pay | Source: Ambulatory Visit | Attending: Obstetrics and Gynecology

## 2014-10-21 DIAGNOSIS — Z90710 Acquired absence of both cervix and uterus: Secondary | ICD-10-CM | POA: Diagnosis present

## 2014-10-21 DIAGNOSIS — D259 Leiomyoma of uterus, unspecified: Principal | ICD-10-CM | POA: Diagnosis present

## 2014-10-21 DIAGNOSIS — Z880 Allergy status to penicillin: Secondary | ICD-10-CM | POA: Diagnosis not present

## 2014-10-21 DIAGNOSIS — Z87410 Personal history of cervical dysplasia: Secondary | ICD-10-CM | POA: Diagnosis not present

## 2014-10-21 DIAGNOSIS — B977 Papillomavirus as the cause of diseases classified elsewhere: Secondary | ICD-10-CM | POA: Diagnosis present

## 2014-10-21 HISTORY — PX: ABDOMINAL HYSTERECTOMY: SHX81

## 2014-10-21 LAB — PREGNANCY, URINE: PREG TEST UR: NEGATIVE

## 2014-10-21 LAB — BASIC METABOLIC PANEL
ANION GAP: 13 (ref 5–15)
BUN: 10 mg/dL (ref 6–23)
CO2: 27 meq/L (ref 19–32)
Calcium: 8.9 mg/dL (ref 8.4–10.5)
Chloride: 100 mEq/L (ref 96–112)
Creatinine, Ser: 0.64 mg/dL (ref 0.50–1.10)
GFR calc Af Amer: >90 mL/min (ref >90)
GFR calc non Af Amer: >90 mL/min (ref >90)
GLUCOSE: 110 mg/dL — AB (ref 70–99)
Potassium: 3.7 mEq/L (ref 3.7–5.3)
SODIUM: 140 meq/L (ref 137–147)

## 2014-10-21 SURGERY — HYSTERECTOMY, ABDOMINAL
Anesthesia: General | Site: Abdomen

## 2014-10-21 MED ORDER — HYDROMORPHONE HCL 1 MG/ML IJ SOLN
0.2500 mg | INTRAMUSCULAR | Status: DC | PRN
  Administered 2014-10-21: 0.5 mg via INTRAVENOUS
  Administered 2014-10-21 (×2): 0.25 mg via INTRAVENOUS

## 2014-10-21 MED ORDER — PROPOFOL 10 MG/ML IV EMUL
INTRAVENOUS | Status: AC
  Filled 2014-10-21: qty 20

## 2014-10-21 MED ORDER — HYDROMORPHONE HCL 1 MG/ML IJ SOLN
INTRAMUSCULAR | Status: DC | PRN
  Administered 2014-10-21 (×2): 0.5 mg via INTRAVENOUS

## 2014-10-21 MED ORDER — NEOSTIGMINE METHYLSULFATE 10 MG/10ML IV SOLN
INTRAVENOUS | Status: AC
  Filled 2014-10-21: qty 1

## 2014-10-21 MED ORDER — ONDANSETRON HCL 4 MG/2ML IJ SOLN
INTRAMUSCULAR | Status: DC | PRN
  Administered 2014-10-21: 4 mg via INTRAVENOUS

## 2014-10-21 MED ORDER — ROCURONIUM BROMIDE 100 MG/10ML IV SOLN
INTRAVENOUS | Status: AC
  Filled 2014-10-21: qty 1

## 2014-10-21 MED ORDER — ONDANSETRON HCL 4 MG/2ML IJ SOLN
INTRAMUSCULAR | Status: AC
  Filled 2014-10-21: qty 2

## 2014-10-21 MED ORDER — LACTATED RINGERS IV SOLN
INTRAVENOUS | Status: DC
  Administered 2014-10-21 (×3): via INTRAVENOUS

## 2014-10-21 MED ORDER — LIDOCAINE HCL (CARDIAC) 20 MG/ML IV SOLN
INTRAVENOUS | Status: DC | PRN
  Administered 2014-10-21: 60 mg via INTRAVENOUS

## 2014-10-21 MED ORDER — DOCUSATE SODIUM 100 MG PO CAPS
100.0000 mg | ORAL_CAPSULE | Freq: Two times a day (BID) | ORAL | Status: DC
  Administered 2014-10-21 – 2014-10-22 (×3): 100 mg via ORAL
  Filled 2014-10-21 (×7): qty 1

## 2014-10-21 MED ORDER — MORPHINE SULFATE (PF) 1 MG/ML IV SOLN
INTRAVENOUS | Status: DC
  Administered 2014-10-21: 3 mg via INTRAVENOUS
  Administered 2014-10-21: 11:00:00 via INTRAVENOUS
  Administered 2014-10-21: 5 mg via INTRAVENOUS
  Administered 2014-10-21: 3 mg via INTRAVENOUS
  Administered 2014-10-22: 1 mg via INTRAVENOUS
  Filled 2014-10-21: qty 25

## 2014-10-21 MED ORDER — PROMETHAZINE HCL 25 MG/ML IJ SOLN: 6.2500 mg | INTRAMUSCULAR | Status: DC | PRN

## 2014-10-21 MED ORDER — SODIUM CHLORIDE 0.9 % IJ SOLN
INTRAMUSCULAR | Status: AC
  Filled 2014-10-21: qty 10

## 2014-10-21 MED ORDER — DEXAMETHASONE SODIUM PHOSPHATE 4 MG/ML IJ SOLN
INTRAMUSCULAR | Status: AC
  Filled 2014-10-21: qty 1

## 2014-10-21 MED ORDER — FENTANYL CITRATE 0.05 MG/ML IJ SOLN
INTRAMUSCULAR | Status: AC
  Filled 2014-10-21: qty 5

## 2014-10-21 MED ORDER — KETOROLAC TROMETHAMINE 30 MG/ML IJ SOLN
30.0000 mg | Freq: Four times a day (QID) | INTRAMUSCULAR | Status: AC
  Administered 2014-10-21 – 2014-10-22 (×4): 30 mg via INTRAVENOUS
  Filled 2014-10-21 (×4): qty 1

## 2014-10-21 MED ORDER — LACTATED RINGERS IV SOLN
INTRAVENOUS | Status: DC
  Administered 2014-10-21 – 2014-10-22 (×3): via INTRAVENOUS

## 2014-10-21 MED ORDER — SODIUM CHLORIDE 0.9 % IJ SOLN: 9.0000 mL | INTRAMUSCULAR | Status: DC | PRN

## 2014-10-21 MED ORDER — LIDOCAINE HCL (CARDIAC) 20 MG/ML IV SOLN
INTRAVENOUS | Status: AC
  Filled 2014-10-21: qty 5

## 2014-10-21 MED ORDER — DEXAMETHASONE SODIUM PHOSPHATE 10 MG/ML IJ SOLN
INTRAMUSCULAR | Status: DC | PRN
  Administered 2014-10-21: 4 mg via INTRAVENOUS

## 2014-10-21 MED ORDER — ONDANSETRON HCL 4 MG/2ML IJ SOLN: 4.0000 mg | Freq: Four times a day (QID) | INTRAMUSCULAR | Status: DC | PRN

## 2014-10-21 MED ORDER — MIDAZOLAM HCL 2 MG/2ML IJ SOLN
INTRAMUSCULAR | Status: DC | PRN
  Administered 2014-10-21: 2 mg via INTRAVENOUS

## 2014-10-21 MED ORDER — LACTATED RINGERS IV SOLN
INTRAVENOUS | Status: DC
  Administered 2014-10-21: 07:00:00 via INTRAVENOUS

## 2014-10-21 MED ORDER — KETOROLAC TROMETHAMINE 30 MG/ML IJ SOLN
INTRAMUSCULAR | Status: AC
  Filled 2014-10-21: qty 1

## 2014-10-21 MED ORDER — 0.9 % SODIUM CHLORIDE (POUR BTL) OPTIME
TOPICAL | Status: DC | PRN
  Administered 2014-10-21: 2000 mL

## 2014-10-21 MED ORDER — HYDROMORPHONE HCL 1 MG/ML IJ SOLN
INTRAMUSCULAR | Status: AC
Start: 2014-10-21 — End: 2014-10-21
  Filled 2014-10-21: qty 1

## 2014-10-21 MED ORDER — SCOPOLAMINE 1 MG/3DAYS TD PT72
1.0000 | MEDICATED_PATCH | Freq: Once | TRANSDERMAL | Status: DC
  Administered 2014-10-21: 1.5 mg via TRANSDERMAL

## 2014-10-21 MED ORDER — HYDROMORPHONE HCL 1 MG/ML IJ SOLN
INTRAMUSCULAR | Status: AC
  Administered 2014-10-21: 0.25 mg via INTRAVENOUS
  Filled 2014-10-21: qty 1

## 2014-10-21 MED ORDER — DIPHENHYDRAMINE HCL 50 MG/ML IJ SOLN: 12.5000 mg | Freq: Four times a day (QID) | INTRAMUSCULAR | Status: DC | PRN

## 2014-10-21 MED ORDER — FENTANYL CITRATE 0.05 MG/ML IJ SOLN
INTRAMUSCULAR | Status: DC | PRN
  Administered 2014-10-21: 50 ug via INTRAVENOUS
  Administered 2014-10-21: 100 ug via INTRAVENOUS
  Administered 2014-10-21 (×2): 50 ug via INTRAVENOUS

## 2014-10-21 MED ORDER — MEPERIDINE HCL 25 MG/ML IJ SOLN: 6.2500 mg | INTRAMUSCULAR | Status: DC | PRN

## 2014-10-21 MED ORDER — KETOROLAC TROMETHAMINE 30 MG/ML IJ SOLN: 15.0000 mg | Freq: Once | INTRAMUSCULAR | Status: DC | PRN

## 2014-10-21 MED ORDER — MIDAZOLAM HCL 2 MG/2ML IJ SOLN
INTRAMUSCULAR | Status: AC
  Filled 2014-10-21: qty 2

## 2014-10-21 MED ORDER — SCOPOLAMINE 1 MG/3DAYS TD PT72
MEDICATED_PATCH | TRANSDERMAL | Status: AC
  Administered 2014-10-21: 1.5 mg via TRANSDERMAL
  Filled 2014-10-21: qty 1

## 2014-10-21 MED ORDER — INFLUENZA VAC SPLIT QUAD 0.5 ML IM SUSY
0.5000 mL | PREFILLED_SYRINGE | INTRAMUSCULAR | Status: AC
  Administered 2014-10-22: 0.5 mL via INTRAMUSCULAR

## 2014-10-21 MED ORDER — DIPHENHYDRAMINE HCL 12.5 MG/5ML PO ELIX
12.5000 mg | ORAL_SOLUTION | Freq: Four times a day (QID) | ORAL | Status: DC | PRN
  Filled 2014-10-21: qty 5

## 2014-10-21 MED ORDER — MENTHOL 3 MG MT LOZG
1.0000 | LOZENGE | OROMUCOSAL | Status: DC | PRN
  Filled 2014-10-21: qty 9

## 2014-10-21 MED ORDER — NALOXONE HCL 0.4 MG/ML IJ SOLN: 0.4000 mg | INTRAMUSCULAR | Status: DC | PRN

## 2014-10-21 MED ORDER — PROPOFOL 10 MG/ML IV BOLUS
INTRAVENOUS | Status: DC | PRN
  Administered 2014-10-21: 150 mg via INTRAVENOUS

## 2014-10-21 MED ORDER — GLYCOPYRROLATE 0.2 MG/ML IJ SOLN
INTRAMUSCULAR | Status: DC | PRN
  Administered 2014-10-21: 0.4 mg via INTRAVENOUS

## 2014-10-21 MED ORDER — IBUPROFEN 600 MG PO TABS
600.0000 mg | ORAL_TABLET | Freq: Four times a day (QID) | ORAL | Status: DC | PRN
  Administered 2014-10-23: 600 mg via ORAL
  Filled 2014-10-21: qty 1

## 2014-10-21 MED ORDER — ONDANSETRON HCL 4 MG PO TABS: 4.0000 mg | ORAL_TABLET | Freq: Four times a day (QID) | ORAL | Status: DC | PRN

## 2014-10-21 MED ORDER — GLYCOPYRROLATE 0.2 MG/ML IJ SOLN
INTRAMUSCULAR | Status: AC
  Filled 2014-10-21: qty 2

## 2014-10-21 MED ORDER — OXYCODONE-ACETAMINOPHEN 5-325 MG PO TABS
1.0000 | ORAL_TABLET | ORAL | Status: DC | PRN
  Administered 2014-10-22 (×3): 1 via ORAL
  Filled 2014-10-21 (×3): qty 1

## 2014-10-21 MED ORDER — NEOSTIGMINE METHYLSULFATE 10 MG/10ML IV SOLN
INTRAVENOUS | Status: DC | PRN
  Administered 2014-10-21: 3 mg via INTRAVENOUS

## 2014-10-21 MED ORDER — KETOROLAC TROMETHAMINE 30 MG/ML IJ SOLN
INTRAMUSCULAR | Status: DC | PRN
  Administered 2014-10-21: 30 mg via INTRAVENOUS

## 2014-10-21 MED ORDER — ROCURONIUM BROMIDE 100 MG/10ML IV SOLN
INTRAVENOUS | Status: DC | PRN
  Administered 2014-10-21: 40 mg via INTRAVENOUS
  Administered 2014-10-21: 10 mg via INTRAVENOUS

## 2014-10-21 SURGICAL SUPPLY — 30 items
APL SKNCLS STERI-STRIP NONHPOA (GAUZE/BANDAGES/DRESSINGS) ×1
BENZOIN TINCTURE PRP APPL 2/3 (GAUZE/BANDAGES/DRESSINGS) ×1 IMPLANT
CANISTER SUCT 3000ML (MISCELLANEOUS) ×2 IMPLANT
CLOTH BEACON ORANGE TIMEOUT ST (SAFETY) ×2 IMPLANT
DRAPE WARM FLUID 44X44 (DRAPE) ×2 IMPLANT
DRSG OPSITE POSTOP 4X10 (GAUZE/BANDAGES/DRESSINGS) ×2 IMPLANT
DURAPREP 26ML APPLICATOR (WOUND CARE) ×2 IMPLANT
GLOVE BIO SURGEON STRL SZ 6.5 (GLOVE) ×2 IMPLANT
GLOVE BIOGEL PI IND STRL 7.0 (GLOVE) ×2 IMPLANT
GLOVE BIOGEL PI INDICATOR 7.0 (GLOVE) ×2
GOWN STRL REUS W/TWL LRG LVL3 (GOWN DISPOSABLE) ×6 IMPLANT
NS IRRIG 1000ML POUR BTL (IV SOLUTION) ×2 IMPLANT
PACK ABDOMINAL GYN (CUSTOM PROCEDURE TRAY) ×2 IMPLANT
PAD OB MATERNITY 4.3X12.25 (PERSONAL CARE ITEMS) ×2 IMPLANT
PROTECTOR NERVE ULNAR (MISCELLANEOUS) ×2 IMPLANT
SHEET LAVH (DRAPES) ×2 IMPLANT
SPONGE LAP 18X18 X RAY DECT (DISPOSABLE) ×4 IMPLANT
STAPLER VISISTAT 35W (STAPLE) ×2 IMPLANT
STRIP CLOSURE SKIN 1/2X4 (GAUZE/BANDAGES/DRESSINGS) ×2 IMPLANT
SUT PLAIN 2 0 XLH (SUTURE) ×1 IMPLANT
SUT VIC AB 0 CT1 18XCR BRD8 (SUTURE) ×2 IMPLANT
SUT VIC AB 0 CT1 27 (SUTURE) ×6
SUT VIC AB 0 CT1 27XBRD ANBCTR (SUTURE) ×3 IMPLANT
SUT VIC AB 0 CT1 8-18 (SUTURE) ×4
SUT VIC AB 2-0 CT1 27 (SUTURE) ×2
SUT VIC AB 2-0 CT1 TAPERPNT 27 (SUTURE) ×1 IMPLANT
SUT VIC AB 3-0 PS2 18 (SUTURE) ×1 IMPLANT
SUT VICRYL 0 TIES 12 18 (SUTURE) ×2 IMPLANT
TOWEL OR 17X24 6PK STRL BLUE (TOWEL DISPOSABLE) ×4 IMPLANT
TRAY FOLEY BAG SILVER LF 14FR (CATHETERS) ×2 IMPLANT

## 2014-10-21 NOTE — Progress Notes (Signed)
Update to History and Physical  No marked change in status since preop visit.  Menses was heavier this cycle.  Still on her OCPs.  Patient examined.   OK to proceed.  Will remove ovaries if has/have gross abnormality.

## 2014-10-21 NOTE — Anesthesia Postprocedure Evaluation (Signed)
  Anesthesia Post-op Note  Patient: Nichole Mcgee  Procedure(s) Performed: Procedure(s): HYSTERECTOMY ABDOMINAL with bilateral salpingectomy (N/A) Patient is awake and responsive. Pain and nausea are reasonably well controlled. Vital signs are stable and clinically acceptable. Oxygen saturation is clinically acceptable. There are no apparent anesthetic complications at this time. Patient is ready for discharge.

## 2014-10-21 NOTE — H&P (Signed)
Nichole Kassel E Amundson de Berton Lan, MD at 09/29/2014 11:11 AM       Status: Signed        Expand All Collapse All   Patient ID: Nichole Mcgee, female   DOB: 01-27-1976, 38 y.o.   MRN: [id number] GYNECOLOGY  VISIT   HPI: 38 y.o.   Married  Caucasian  female    G1P0010 with Patient's last menstrual period was 09/15/2014 (approximate).   here to discuss surgery.    Planning on hysterectomy on 10/21/14 for uterine fibroid.   Currently on Yasmin.   No history of painful menstruation prior to taking OCPs. Declines future childbearing.   History of LEEP for cervical dysplasia.   Recent pap normal.   Labs 07/16/14:  Hgb 13.2. TSH 5.12, T4 10.6.  Ultrasound 07/24/14 -   Uterus with solitary 8.4 cm anterior intramural fibroid.    EMS 6.11 mm and distorted by the fibroid.   Ovaries normal.   No free fluid.    GYNECOLOGIC HISTORY: Patient's last menstrual period was 09/15/2014 (approximate). Contraception:  OCP's--Yasmin   Menopausal hormone therapy: n/a Sexually transmitted diseases:  HPV+   GYN procedures and prior surgeries: LEEP - CIN 3 2006 DES exposure:  None    Blood transfusions: No    Last mammogram: N/A                 Last pap and high risk HPV testing: 07/16/14 - Neg. HR HPV neg    History of abnormal pap smear:  Yes, HSIL (ASC-H) +HPV 2006     OB History      Gravida  Para  Term  Preterm  AB  TAB  SAB  Ectopic  Multiple  Living     1  0      1          0             Patient Active Problem List     Diagnosis  Date Noted   .  Uterine fibroid  07/16/2014       Past Medical History   Diagnosis  Date   .  Abnormal Pap smear  2006       HSIL (ASC-H) + HPV       Past Surgical History   Procedure  Laterality  Date   .  Cervical biopsy  w/ loop electrode excision    06/03/05       HGSIL CIN II/CINIII   .  Wisdom tooth extraction           Current Outpatient Prescriptions   Medication  Sig  Dispense  Refill   .  Calcium-Magnesium-Vitamin D (CALCIUM 500 PO)  Take  by mouth daily.       .  drospirenone-ethinyl estradiol (YASMIN 28) 3-0.03 MG tablet  Take 1 tablet by mouth daily.  1 Package  11   .  Multiple Vitamin (MULTI-VITAMIN DAILY PO)  Take by mouth daily.          No current facility-administered medications for this visit.      ALLERGIES: Penicillins    Family History   Problem  Relation  Age of Onset   .  Diabetes  Mother         AODM   .  Thyroid disease  Mother         hypo   .  Cancer  Father         carcinoma in sinus cavity   .  Diabetes  Maternal Grandmother         AODM       History      Social History   .  Marital Status:  Unknown       Spouse Name:  N/A       Number of Children:  N/A   .  Years of Education:  N/A      Occupational History   .  Not on file.      Social History Main Topics   .  Smoking status:  Never Smoker    .  Smokeless tobacco:  Never Used   .  Alcohol Use:  1.2 oz/week       2 Cans of beer per week   .  Drug Use:  No   .  Sexual Activity:       Partners:  Male       Birth Control/ Protection:  Pill         Comment: Yasmin      Other Topics  Concern   .  Not on file      Social History Narrative     ROS:  Pertinent items are noted in HPI.  PHYSICAL EXAMINATION:    BP 110/70 mmHg  Pulse 70  Ht 5' 2.5" (1.588 m)  Wt 142 lb 12.8 oz (64.774 kg)  BMI 25.69 kg/m2  LMP 09/15/2014 (Approximate)     General appearance: alert, cooperative and appears stated age Lungs: clear to auscultation bilaterally Heart: regular rate and rhythm Abdomen: uterus to umbilicus - 2 cm, soft, non-tender.  No abnormal inguinal nodes palpated  Pelvic: External genitalia:  no lesions              Urethra:  normal appearing urethra with no masses, tenderness or lesions              Bartholins and Skenes: normal                  Vagina: normal appearing vagina with normal color and discharge, no lesions              Cervix: consistent with prior LEEP.                    Bimanual Exam:  Uterus:  uterus  is 16 week size and out of the pelvis, nontender                                      Adnexa: normal adnexa in size, nontender and no masses                                        ASSESSMENT  Uterine fibroid.  Enlarging.   Declines future childbearing.   History of LEEP.  Last pap normal.   PLAN  Plan is for total abdominal hysterectomy with bilateral salpingectomy, possible bilateral salpingo-oophorectomy if endometriosis or abnormality detected.   Reviewed benefits, risks, and alternatives to surgery.  Risks include but are not limited to bleeding, infection, damage to surrounding organs, reaction to anesthesia, pneumonia, hernias, neuropathies, DVT, PE, death, and need for reoperation.   Patient wishes to proceed.   She will do a magnesium citrate bowel preparation.   Can continue OCPs until after surgery is done.  An After Visit Summary was printed and given to the patient.  __25____ minutes face to face time of which over 50% was spent in counseling.

## 2014-10-21 NOTE — Brief Op Note (Signed)
10/21/2014  9:27 AM  PATIENT:  Archie Balboa  38 y.o. female  PRE-OPERATIVE DIAGNOSIS:  uterine fibroid  POST-OPERATIVE DIAGNOSIS:  uterine fibroid  PROCEDURE:  Procedure(s): HYSTERECTOMY ABDOMINAL with bilateral salpingectomy (N/A)  SURGEON:  Surgeon(s) and Role:    * Brook E Amundson de Berton Lan, MD - Primary    * Lyman Speller, MD - Assisting  PHYSICIAN ASSISTANT: NA  ASSISTANTS: Lyman Speller, MD   ANESTHESIA:   general  EBL:  Total I/O In: 2100 [I.V.:2100] Out: 200 [Urine:150; Blood:50]  BLOOD ADMINISTERED:none  DRAINS: Urinary Catheter (Foley)   LOCAL MEDICATIONS USED:  NONE  SPECIMEN:  Source of Specimen:  Uterus, cervix, bilateral tubes  DISPOSITION OF SPECIMEN:  PATHOLOGY  COUNTS:  YES  TOURNIQUET:  * No tourniquets in log *  DICTATION: .Other Dictation: Dictation Number    PLAN OF CARE: Admit to inpatient   PATIENT DISPOSITION:  PACU - hemodynamically stable.   Delay start of Pharmacological VTE agent (>24hrs) due to surgical blood loss or risk of bleeding: not applicable

## 2014-10-21 NOTE — Progress Notes (Signed)
Day of Surgery Procedure(s) (LRB): HYSTERECTOMY ABDOMINAL with bilateral salpingectomy (N/A)  Subjective: Patient reports tolerating clear liquids.  Ambulating in the halls.  Good pain control on PCA and with Toradol.   Objective: I have reviewed patient's vital signs and intake and output. BP 98/51, P 75.  General: alert Resp: clear to auscultation bilaterally Cardio: regular rate and rhythm, S1, S2 normal, no murmur, click, rub or gallop GI: soft, non-tender; bowel sounds normal; no masses,  no organomegaly and incision:  Minimal dried blood  Extremities:  PAS and Ted hose on.  Vaginal Bleeding: minimal  Assessment: s/p Procedure(s): HYSTERECTOMY ABDOMINAL with bilateral salpingectomy (N/A): stable  Plan: Advance diet Continue foley due to  post op state.  CBC and BMP in am.  Discussed surgery and findings with patient.   LOS: 0 days    Arloa Koh 10/21/2014, 5:19 PM

## 2014-10-21 NOTE — Transfer of Care (Signed)
Immediate Anesthesia Transfer of Care Note  Patient: Nichole Mcgee  Procedure(s) Performed: Procedure(s): HYSTERECTOMY ABDOMINAL with bilateral salpingectomy (N/A)  Patient Location: PACU  Anesthesia Type:General  Level of Consciousness: awake, alert , oriented and patient cooperative  Airway & Oxygen Therapy: Patient Spontanous Breathing and Patient connected to nasal cannula oxygen  Post-op Assessment: Report given to PACU RN and Post -op Vital signs reviewed and stable  Post vital signs: Reviewed and stable  Complications: No apparent anesthesia complications

## 2014-10-21 NOTE — Anesthesia Preprocedure Evaluation (Addendum)
Anesthesia Evaluation  Patient identified by MRN, date of birth, ID band Patient awake    Reviewed: Allergy & Precautions, H&P , NPO status , Patient's Chart, lab work & pertinent test results  Airway Mallampati: II  TM Distance: >3 FB Neck ROM: full    Dental no notable dental hx. (+) Teeth Intact   Pulmonary neg pulmonary ROS,    Pulmonary exam normal       Cardiovascular negative cardio ROS      Neuro/Psych negative neurological ROS  negative psych ROS   GI/Hepatic negative GI ROS, Neg liver ROS,   Endo/Other  negative endocrine ROS  Renal/GU negative Renal ROS     Musculoskeletal   Abdominal Normal abdominal exam  (+)   Peds  Hematology negative hematology ROS (+)   Anesthesia Other Findings   Reproductive/Obstetrics negative OB ROS                             Anesthesia Physical Anesthesia Plan  ASA: I  Anesthesia Plan: General   Post-op Pain Management:    Induction: Intravenous  Airway Management Planned: Oral ETT  Additional Equipment:   Intra-op Plan:   Post-operative Plan: Extubation in OR  Informed Consent: I have reviewed the patients History and Physical, chart, labs and discussed the procedure including the risks, benefits and alternatives for the proposed anesthesia with the patient or authorized representative who has indicated his/her understanding and acceptance.   Dental Advisory Given  Plan Discussed with: CRNA, Surgeon and Anesthesiologist  Anesthesia Plan Comments:         Anesthesia Quick Evaluation

## 2014-10-21 NOTE — Op Note (Signed)
NAMEELMARIE, DEVLIN NO.:  [id number]  MEDICAL RECORD NO.:  [id number]  LOCATION:  9303                          FACILITY:  [facility]  PHYSICIAN:  Lenard Galloway, M.D.   DATE OF BIRTH:  22-Feb-1976  DATE OF PROCEDURE:  10/21/2014 DATE OF DISCHARGE:                              OPERATIVE REPORT   PREOPERATIVE DIAGNOSIS:  Uterine fibroid.  POSTOPERATIVE DIAGNOSIS:  Uterine fibroid.  PROCEDURE:  Total abdominal hysterectomy with bilateral salpingectomy.  SURGEON:  Lenard Galloway, M.D.  ASSISTANT:  Lyman Speller, MD  ANESTHESIA:  General endotracheal.  IV FLUIDS:  2100 mL Ringer's lactate.  ESTIMATED BLOOD LOSS:  50 mL.  URINE OUTPUT:  150 mL.  COMPLICATIONS:  None.  INDICATIONS FOR THE PROCEDURE:  The patient is a 38 year old, gravida 1, para 0-0-1-0 Caucasian female currently on Yasmin oral contraceptives who presents for routine examination and is noted to have an enlarged uterus on pelvic exam.  The patient had an ultrasound performed on July 24, 2014, which documented a uterus with a solitary 8.4 cm anterior intramural fibroid.  The endometrial stripe measured 6.11 mm and was distorted due to the fibroid.  There were no adnexal masses noted.  The patient had a history of a LEEP procedure for CIN-III in 2006 and her last Pap smear on July 16, 2014, was normal and she had a negative high-risk HPV status.  Discussion was held with the patient regarding her options for care.  The prior year, the patient had, had a normal pelvic examination.  The decision was made to proceed with a total abdominal hysterectomy with bilateral salpingectomy and possible bilateral oophorectomy if the ovaries were abnormal.  The patient declined any future childbearing.  Risks, benefits, and alternatives were reviewed with the patient who wished to proceed.  FINDINGS:  Examination under anesthesia revealed a 15-week size uterus which filled most of the pelvis.   No adnexal masses were palpated separately from the uterus.  At the time of hysterectomy, the patient was noted to have a 9 cm fundal fibroid which was boggy.  There was a separate fibroid palpated on the posterior fundus which felt to be approximately 2 cm.  The fallopian tubes and ovaries were unremarkable.  There was no evidence of endometriosis in the pelvis.  SPECIMEN:  The uterus with cervix and bilateral fallopian tubes were sent to Pathology.  DESCRIPTION OF PROCEDURE:  The patient was reidentified in the preoperative hold area.  She received gentamicin and clindamycin for antibiotic prophylaxis.  She received TED hose and PAS stockings for DVT prophylaxis.  In the operating room, the patient received general endotracheal anesthesia and was then placed in the dorsal lithotomy position with Allen stirrups.  The abdomen and vagina were sterilely prepped and draped.  A Foley catheter was placed inside the bladder and left to gravity drainage.  The procedure began by creating a Pfannenstiel incision sharply with a scalpel.  The incision was carried down to the fascia using monopolar cautery for hemostasis.  The fascia was incised to the right and left with the Mayo scissors.  Hemostasis was created with monopolar cautery. The parietal peritoneum was elevated with 2 hemostat clamps and  was opened sharply.  The remaining rectus muscles were divided anteriorly. The peritoneal incision was then opened cranially and caudally using the Metzenbaum scissors.  At this time, an exploration of the pelvis was performed and the uterus was delivered through the Pfannenstiel incision.  The right round ligament was isolated with a Babcock clamp and was suture ligated with a transfixing suture of 0 Vicryl.  The uterus was elevated by placing Kelly clamps across the adnexal structures.  The right round ligament was divided with a monopolar cautery instrument. The broad ligament was opened  anteriorly and posteriorly using monopolar cautery and the Metzenbaum scissors.  The bladder flap was taken down anteriorly using monopolar cautery and the Metzenbaum scissors.  The window was created through the broad ligament posteriorly.  The right fallopian tube was separated from the mesosalpinx using monopolar cautery for hemostasis.  The clamp was placed across the utero-ovarian ligament at this time and this pedicle was sharply divided with a Mayo scissors.  The pedicle was free tied with 0 Vicryl followed by a suture ligature of the same.  The uterine artery on the patient's right-hand side was then skeletonized.  The peritoneum was taken down posteriorly using a Metzenbaum scissors.  The uterine artery was doubly clamped and sharply divided and sutured with a 0 Vicryl suture.  The same procedure that was performed on the patient's right-hand side was then repeated on the patient's left-hand side.  The uterine artery on the patient's left- hand side was doubly sutured with 0 Vicryl.  The bladder was taken down further off of a cervix using sharp and blunt dissection.  The bladder flap was taken down very easily.  Straight Heaney clamps were used to clamp the inferior branches of the uterine arteries bilaterally.  These pedicles were sharply divided and suture ligated with 0 Vicryl.  Curved Heaney clamps were then placed across the uterosacral ligaments and the top of the vagina bilaterally. The pedicles were sharply divided with the Mayo scissors and at this point, there was only a small portion of the vagina between these 2 pedicles and so the specimen was completely excised and sent to Pathology.  The angles were sutured with transfixing sutures of 0 Vicryl.  An additional figure-of-eight suture of 0 Vicryl was placed in the midline.  At this time, the pelvis was irrigated and suctioned and all of the pedicle sites were noted to be hemostatic.  The ureters were identified  bilaterally along the pelvic sidewalls and were noted to peristalse.  The abdomen was closed at this time.  The parietal peritoneum was closed with a running suture of 2-0 Vicryl.  Irrigation was performed at this time along the rectus muscles and hemostasis was good.  A single figure- of-eight suture was used to reapproximate the muscles in the midline. The fascia was then closed at this time with a running suture of 0 Vicryl.  This was performed bilaterally.  The subcutaneous layer was irrigated, suctioned, and made hemostatic with monopolar cautery. Interrupted sutures of 2-0 plain gut suture were placed in the subcutaneous layer and then a subcuticular suture of 3-0 Vicryl was used to close the skin.  Steri-Strips and benzoin were placed over the incision followed by a honeycomb dressing.  This concluded the patient's procedure.  There were no complications. All needle, instrument, and sponge counts were correct.  The patient is escorted to the recovery room in stable and awake condition.     Lenard Galloway, M.D.  BES/MEDQ  D:  10/21/2014  T:  10/21/2014  Job:  [id number]

## 2014-10-21 NOTE — Plan of Care (Signed)
Problem: Phase I Progression Outcomes Goal: Pain controlled with appropriate interventions Outcome: Completed/Met Date Met:  10/21/14 Goal: Admission history reviewed Outcome: Completed/Met Date Met:  10/21/14 Goal: I & O every 4 hrs or as ordered Outcome: Completed/Met Date Met:  10/21/14 Goal: IS, TCDB as ordered Outcome: Completed/Met Date Met:  10/21/14

## 2014-10-22 ENCOUNTER — Encounter (HOSPITAL_COMMUNITY): Payer: Self-pay | Admitting: Obstetrics and Gynecology

## 2014-10-22 LAB — BASIC METABOLIC PANEL
Anion gap: 9 (ref 5–15)
BUN: 7 mg/dL (ref 6–23)
CO2: 26 meq/L (ref 19–32)
CREATININE: 0.55 mg/dL (ref 0.50–1.10)
Calcium: 7.9 mg/dL — ABNORMAL LOW (ref 8.4–10.5)
Chloride: 103 mEq/L (ref 96–112)
GFR calc Af Amer: >90 mL/min (ref >90)
GFR calc non Af Amer: >90 mL/min (ref >90)
GLUCOSE: 115 mg/dL — AB (ref 70–99)
Potassium: 4.2 mEq/L (ref 3.7–5.3)
Sodium: 138 mEq/L (ref 137–147)

## 2014-10-22 LAB — CBC
HCT: 33.6 % — ABNORMAL LOW (ref 36.0–46.0)
Hemoglobin: 11.5 g/dL — ABNORMAL LOW (ref 12.0–15.0)
MCH: 30.4 pg (ref 26.0–34.0)
MCHC: 34.2 g/dL (ref 30.0–36.0)
MCV: 88.9 fL (ref 78.0–100.0)
PLATELETS: 256 10*3/uL (ref 150–400)
RBC: 3.78 MIL/uL — AB (ref 3.87–5.11)
RDW: 13.6 % (ref 11.5–15.5)
WBC: 10.7 10*3/uL — ABNORMAL HIGH (ref 4.0–10.5)

## 2014-10-22 NOTE — Anesthesia Postprocedure Evaluation (Signed)
Anesthesia Post Note  Patient: Nichole Mcgee  Procedure(s) Performed: Procedure(s): HYSTERECTOMY ABDOMINAL with bilateral salpingectomy (N/A)  Anesthesia type: General  Patient location: [facility]  Post pain: Pain level controlled  Post assessment: Post-op Vital signs reviewed  Last Vitals: BP 87/47 mmHg  Pulse 92  Temp(Src) 37.4 C (Oral)  Resp 18  Ht 5' 2.5" (1.588 m)  Wt 139 lb (63.05 kg)  BMI 25.00 kg/m2  SpO2 98%  Post vital signs: Reviewed  Level of consciousness: awake  Complications: No apparent anesthesia complications

## 2014-10-22 NOTE — Progress Notes (Signed)
1 Day Post-Op Procedure(s) (LRB): HYSTERECTOMY ABDOMINAL with bilateral salpingectomy (N/A)  Subjective: Patient reports tolerating PO and no problems voiding.   On regular diet.   Objective: I have reviewed patient's vital signs, intake and output and labs. T 99.3, BP 87/47, P 92 UO 2700 yesterday.   General: alert Resp: clear to auscultation bilaterally Cardio: regular rate and rhythm, S1, S2 normal, no murmur, click, rub or gallop GI: soft, non-tender; bowel sounds normal; no masses,  no organomegaly and incision:  Some dried blood - minimal. Vaginal Bleeding: minimal  Assessment: s/p Procedure(s): HYSTERECTOMY ABDOMINAL with bilateral salpingectomy (N/A): progressing well  Plan: Advance diet Encourage ambulation Advance to PO medication  LOS: 1 day    Arloa Koh 10/22/2014, 7:59 AM

## 2014-10-22 NOTE — Addendum Note (Signed)
Addendum  created 10/22/14 9622 by Flossie Dibble, CRNA   Modules edited: Notes Section   Notes Section:  File: [id number]

## 2014-10-22 NOTE — Plan of Care (Signed)
Problem: Consults Goal: GYN POST OP 3-5 days Patient Education (See Patient Education module for education specifics.)  Outcome: Completed/Met Date Met:  10/22/14  Problem: Phase I Progression Outcomes Goal: Dangle/OOB as tolerated per MD order Outcome: Completed/Met Date Met:  10/22/14 Goal: VS, stable, temp < 100.4 degrees F Outcome: Completed/Met Date Met:  10/22/14 Goal: Other Phase I Outcomes/Goals Outcome: Completed/Met Date Met:  10/22/14  Problem: Phase II Progression Outcomes Goal: Pain controlled Outcome: Completed/Met Date Met:  10/22/14 Goal: Progress activity as tolerated unless otherwise ordered Outcome: Completed/Met Date Met:  10/22/14 Goal: Afebrile, VS remain stable Outcome: Completed/Met Date Met:  10/22/14 Goal: Incision/dressings dry and intact Outcome: Completed/Met Date Met:  10/22/14 Goal: Other Phase II Outcomes/Goals Outcome: Completed/Met Date Met:  10/22/14     

## 2014-10-22 NOTE — Plan of Care (Signed)
Problem: Phase III Progression Outcomes Goal: Pain controlled on oral analgesia Outcome: Completed/Met Date Met:  10/22/14 Goal: Ambulates without assistance Outcome: Completed/Met Date Met:  10/22/14 Goal: Other Phase III Outcomes/Goals Outcome: Completed/Met Date Met:  10/22/14  Problem: Discharge Progression Outcomes Goal: Pain controlled with appropriate interventions Outcome: Completed/Met Date Met:  10/22/14 Goal: Hemodynamically stable Outcome: Completed/Met Date Met:  34/03/70 Goal: Complications resolved/controlled Outcome: Completed/Met Date Met:  10/22/14 Goal: Tolerating diet Outcome: Completed/Met Date Met:  10/22/14

## 2014-10-23 LAB — CBC WITH DIFFERENTIAL/PLATELET
BASOS PCT: 0 % (ref 0–1)
Basophils Absolute: 0 10*3/uL (ref 0.0–0.1)
EOS PCT: 1 % (ref 0–5)
Eosinophils Absolute: 0.1 10*3/uL (ref 0.0–0.7)
HEMATOCRIT: 34.9 % — AB (ref 36.0–46.0)
Hemoglobin: 11.9 g/dL — ABNORMAL LOW (ref 12.0–15.0)
Lymphocytes Relative: 9 % — ABNORMAL LOW (ref 12–46)
Lymphs Abs: 1.1 10*3/uL (ref 0.7–4.0)
MCH: 30.5 pg (ref 26.0–34.0)
MCHC: 34.1 g/dL (ref 30.0–36.0)
MCV: 89.5 fL (ref 78.0–100.0)
MONO ABS: 0.9 10*3/uL (ref 0.1–1.0)
Monocytes Relative: 8 % (ref 3–12)
Neutro Abs: 9.4 10*3/uL — ABNORMAL HIGH (ref 1.7–7.7)
Neutrophils Relative %: 82 % — ABNORMAL HIGH (ref 43–77)
Platelets: 245 10*3/uL (ref 150–400)
RBC: 3.9 MIL/uL (ref 3.87–5.11)
RDW: 13.8 % (ref 11.5–15.5)
WBC: 11.4 10*3/uL — ABNORMAL HIGH (ref 4.0–10.5)

## 2014-10-23 MED ORDER — OXYCODONE-ACETAMINOPHEN 5-325 MG PO TABS: 1.0000 | ORAL_TABLET | ORAL | Status: DC | PRN

## 2014-10-23 MED ORDER — IBUPROFEN 600 MG PO TABS: 600.0000 mg | ORAL_TABLET | Freq: Four times a day (QID) | ORAL | Status: DC | PRN

## 2014-10-23 NOTE — Progress Notes (Signed)
Pt is discharged in the care of husband with N.T. Escort. Discharge instructions with Rx were given to pt with good understanding. Questions were asked and answered, Denies pain or discomfort. Spirits are good. No equipment needed for home use.Abdominal incision clean and dry.

## 2014-10-23 NOTE — Progress Notes (Signed)
2 Days Post-Op Procedure(s) (LRB): HYSTERECTOMY ABDOMINAL with bilateral salpingectomy (N/A)  Subjective: Patient reports tolerating PO, + flatus and no problems voiding.   Received flu vaccine.  Denies any cough or congestion.  Denies dysuria.  States she feels well.   Objective: I have reviewed patient's vital signs, intake and output and labs. Tmax 99.5, T now 99.2. BP 91/58.  P 72. RR 18. WBC 11.4, 82% neutrophils.   General: alert GI: incision:  Dried blood.  Abdomen soft and nontnender.  No masses. Vaginal Bleeding: minimal  Assessment: s/p Procedure(s): HYSTERECTOMY ABDOMINAL with bilateral salpingectomy (N/A): progressing well Mild elevation in WBC and temp.  Status post flu vaccine.  No sign of acute infection.   Plan: Discharge home  Instructions and precautions given.  Rx for Percocet and Motrin.  Follow up in 4 days or sooner as needed.    LOS: 2 days    Arloa Koh 10/23/2014, 8:14 AM

## 2014-10-23 NOTE — Discharge Instructions (Signed)
Abdominal Hysterectomy Abdominal hysterectomy is a surgical procedure to remove your womb (uterus). Your uterus is the muscular organ that contains a developing baby. This surgery is done for many reasons. You may need an abdominal hysterectomy if you have cancer, growths (tumors), long-term pain, or bleeding. You may also have this procedure if your uterus has slipped down into your vagina (uterine prolapse). Depending on why you need an abdominal hysterectomy, you may also have other reproductive organs removed. These could include the part of your vagina that connects with your uterus (cervix), the organs that make eggs (ovaries), and the tubes that connect the ovaries to the uterus (fallopian tubes). LET YOUR HEALTH CARE PROVIDER KNOW ABOUT:   Any allergies you have.  All medicines you are taking, including vitamins, herbs, eye drops, creams, and over-the-counter medicines.  Previous problems you or members of your family have had with the use of anesthetics.  Any blood disorders you have.  Previous surgeries you have had.  Medical conditions you have. RISKS AND COMPLICATIONS Generally, this is a safe procedure. However, as with any procedure, problems can occur. Infection is the most common problem after an abdominal hysterectomy. Other possible problems include:  Bleeding.  Formation of blood clots that may break free and travel to your lungs.  Injury to other organs near your uterus.  Nerve injury causing nerve pain.  Decreased interest in sex or pain during sexual intercourse. BEFORE THE PROCEDURE  Abdominal hysterectomy is a major surgical procedure. It can affect the way you feel about yourself. Talk to your health care provider about the physical and emotional changes hysterectomy may cause.  You may need to have blood work and X-rays done before surgery.  Quit smoking if you smoke. Ask your health care provider for help if you are struggling to quit.  Stop taking  medicines that thin your blood as directed by your health care provider.  You may be instructed to take antibiotic medicines or laxatives before surgery.  Do not eat or drink anything for 6-8 hours before surgery.  Take your regular medicines with a small sip of water.  Bathe or shower the night or morning before surgery. PROCEDURE  Abdominal hysterectomy is done in the operating room at the hospital.  In most cases, you will be given a medicine that makes you go to sleep (general anesthetic).  The surgeon will make a cut (incision) through the skin in your lower belly.  The incision may be about 5-7 inches long. It may go side-to-side or up-and-down.  The surgeon will move aside the body tissue that covers your uterus. The surgeon will then carefully take out your uterus along with any of your other reproductive organs that need to be removed.  Bleeding will be controlled with clamps or sutures.  The surgeon will close your incision with sutures or metal clips. AFTER THE PROCEDURE  You will have some pain immediately after the procedure.  You will be given pain medicine in the recovery room.  You will be taken to your hospital room when you have recovered from the anesthesia.  You may need to stay in the hospital for 2-5 days.  You will be given instructions for recovery at home. Document Released: 11/05/2013 Document Reviewed: 11/05/2013 [employer] Patient Information 2015 [employer]. This information is not intended to replace advice given to you by your health care provider. Make sure you discuss any questions you have with your health care provider.  

## 2014-10-26 NOTE — Discharge Summary (Signed)
Physician Discharge Summary  Patient ID: Nichole Mcgee MRN: [id number] DOB/AGE: 40-Sep-1977 40 y.o.  Admit date: 10/21/2014 Discharge date: 10/23/14  Admission Diagnoses: 1.  Uterine fibroid  Discharge Diagnoses:  1.  Uterine fibroid 2.  Status post total abdominal hysterectomy with bilateral salpingectomy.  Active Problems:   Status post total abdominal hysterectomy   Discharged Condition: good  Hospital Course: The patient was admitted on 10/21/14 for a total abdominal hysterectomy with bilateral salpingectomy which were performed without complication while under general anesthesia.  The patient's post op course was uneventful.  She had a morphine PCA and Toradol for pain control initially, and this was converted over to Percocet and Motrin on post op day one when the patient began taking po well.  She ambulated independently and wore PAS and Ted hose for DVT prophylaxis while in bed.  Her foley catheter were removed on post op day one, and she voided well. The patient's vital signs remained stable and she demonstrated a low grade fever to 99.5 during her hospitalization.  Her WBC was 11.4 on post op day two with 82% neutrophils.  She demonstrated no overt signs of infection.  She was using her incentive spirometer. The patient's post op day two Hgb was  11.9, and she was tolerating this well. She was found to be in good condition and ready for discharge on post op day two.     Consults: None  Significant Diagnostic Studies: labs:  See hospital course above.   Treatments: surgery:  Total abdominal hysterectomy with bilateral salpingectomy on 10/21/14 under the direction of Dr. Josefa Half.  Discharge Exam: Blood pressure 91/58, pulse 72, temperature 99.2 F (37.3 C), temperature source Oral, resp. rate 18, height 5' 2.5" (1.588 m), weight 139 lb (63.05 kg), SpO2 97 %.  General: alert GI: incision:  Dried blood. Abdomen soft and nontnender. No masses. Vaginal Bleeding:  minimal  Disposition: 01-Home or Self Care  Instructions and precautions reviewed.  Will take her temperature and report if above 100.5 or any other concern.     Medication List    STOP taking these medications        drospirenone-ethinyl estradiol 3-0.03 MG tablet  Commonly known as:  YASMIN 28      TAKE these medications        CALCIUM 500 PO  Take by mouth daily.     ibuprofen 600 MG tablet  Commonly known as:  ADVIL,MOTRIN  Take 1 tablet (600 mg total) by mouth every 6 (six) hours as needed (mild pain).     MULTI-VITAMIN DAILY PO  Take by mouth daily.     oxyCODONE-acetaminophen 5-325 MG per tablet  Commonly known as:  PERCOCET/ROXICET  Take 1-2 tablets by mouth every 4 (four) hours as needed for severe pain (moderate to severe pain (when tolerating fluids)).           Follow-up Information    Follow up with Arloa Koh, MD In 4 days.   Specialty:  Obstetrics and Gynecology   Contact information:   [address] [place] [address] [phone]       Signed: Arloa Koh 10/26/2014, 6:24 PM

## 2014-10-27 ENCOUNTER — Encounter: Payer: Self-pay | Admitting: Obstetrics and Gynecology

## 2014-10-27 ENCOUNTER — Ambulatory Visit (INDEPENDENT_AMBULATORY_CARE_PROVIDER_SITE_OTHER): Payer: 59 | Admitting: Obstetrics and Gynecology

## 2014-10-27 VITALS — BP 98/60 | HR 80 | Ht 62.5 in | Wt 135.4 lb

## 2014-10-27 DIAGNOSIS — Z9889 Other specified postprocedural states: Secondary | ICD-10-CM

## 2014-10-27 NOTE — Progress Notes (Signed)
Patient ID: Nichole Mcgee, female   DOB: [birth year], 38 y.o.   MRN: [id number] GYNECOLOGY  VISIT   HPI: 38 y.o.   Married  Caucasian  female   [id number] with Patient's last menstrual period was 10/15/2014 (approximate).   here for 1 week post TAH/BSO.   Taking one percocet and one motrin daily.  Bowel function has returned.  Bladder working well.  No vaginal bleeding.   Checking temperature at home 98.6  and 98.7.  Final pathology showed benign fibroid, cervix, endometrium, and tubes.  8.5 cm fibroid noted.  Pathology specimen weighed 412 grams.   GYNECOLOGIC HISTORY: Patient's last menstrual period was 10/15/2014 (approximate). Contraception:  hysterectomy  Menopausal hormone therapy: n/a        OB History    Gravida Para Term Preterm AB TAB SAB Ectopic Multiple Living   1 0   1     0         Patient Active Problem List   Diagnosis Date Noted  . Status post total abdominal hysterectomy 10/21/2014  . Uterine fibroid 07/16/2014    Past Medical History  Diagnosis Date  . 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 Cervical biopsy  w/ loop electrode excision  06/03/05    HGSIL CIN II/CINIII  . Wisdom tooth extraction    . Tonsillectomy and adenoidectomy    . Abdominal hysterectomy N/A 10/21/2014    Procedure: HYSTERECTOMY ABDOMINAL with bilateral salpingectomy;  Surgeon: Jamey Reas de Berton Lan, MD;  Location: [facility];  Service: Gynecology;  Laterality: N/A;    Current Outpatient Prescriptions  Medication Sig Dispense Refill  . Calcium-Magnesium-Vitamin D (CALCIUM 500 PO) Take by mouth daily.    Marland Kitchen ibuprofen (ADVIL,MOTRIN) 600 MG tablet Take 1 tablet (600 mg total) by mouth every 6 (six) hours as needed (mild pain). 30 tablet 0  . Multiple Vitamin (MULTI-VITAMIN DAILY PO) Take by mouth daily.    Marland Kitchen oxyCODONE-acetaminophen (PERCOCET/ROXICET) 5-325 MG per tablet Take 1-2 tablets by mouth every 4 (four)  hours as needed for severe pain (moderate to severe pain (when tolerating fluids)). 30 tablet 0   No current facility-administered medications for this visit.     ALLERGIES: Penicillins  Family History  Problem Relation Age of Onset  . Diabetes Mother     AODM  . Thyroid disease Mother     hypo  . Cancer Father     carcinoma in sinus cavity  . Diabetes Maternal Grandmother     AODM    History   Social History  . Marital Status: Married    Spouse Name: N/A    Number of Children: N/A  . Years of Education: N/A   Occupational History  . Not on file.   Social History Main Topics  . Smoking status: Never Smoker   . Smokeless tobacco: Never Used  . Alcohol Use: 0.0 oz/week    0 Not specified per week     Comment: socially  . Drug Use: No  . Sexual Activity:    Partners: Male    Birth Control/ Protection: Surgical     Comment: TAH/BSO   Other Topics Concern  . Not on file   Social History Narrative    ROS:  Pertinent items are noted in HPI.  PHYSICAL EXAMINATION:    BP 98/60 mmHg  Pulse 80  Ht 5' 2.5" (1.588 m)  Wt 135 lb 6.4  oz (61.417 kg)  BMI 24.35 kg/m2  LMP 10/15/2014 (Approximate)    Temp 98.7 F General appearance: alert, cooperative and appears stated age   Abdomen: Pfannenstiel incision - intact.  Steristrips removed due to moisture noted from water.  No bleeding noted.      ASSESSMENT  Doing well post op.   PLAN  Continue decreased activity for 5 more weeks.  Final post op check in 4 1/2 weeks.    An After Visit Summary was printed and given to the patient.  __15____ minutes face to face time of which over 50% was spent in counseling.

## 2014-11-18 ENCOUNTER — Emergency Department (HOSPITAL_COMMUNITY)
Admission: EM | Admit: 2014-11-18 | Discharge: 2014-11-18 | Disposition: A | Discharge status: Home / Self Care | Payer: 59 | Attending: Emergency Medicine | Admitting: Emergency Medicine

## 2014-11-18 ENCOUNTER — Telehealth: Payer: Self-pay | Admitting: Obstetrics and Gynecology

## 2014-11-18 DIAGNOSIS — Z79899 Other long term (current) drug therapy: Secondary | ICD-10-CM | POA: Diagnosis not present

## 2014-11-18 DIAGNOSIS — E876 Hypokalemia: Secondary | ICD-10-CM

## 2014-11-18 DIAGNOSIS — M79609 Pain in unspecified limb: Secondary | ICD-10-CM

## 2014-11-18 DIAGNOSIS — Z880 Allergy status to penicillin: Secondary | ICD-10-CM | POA: Insufficient documentation

## 2014-11-18 DIAGNOSIS — M79605 Pain in left leg: Secondary | ICD-10-CM | POA: Diagnosis present

## 2014-11-18 LAB — I-STAT CHEM 8, ED
BUN: 8 mg/dL (ref 6–23)
Calcium, Ion: 1.1 mmol/L — ABNORMAL LOW (ref 1.12–1.23)
Chloride: 104 mEq/L (ref 96–112)
Creatinine, Ser: 0.5 mg/dL (ref 0.50–1.10)
Glucose, Bld: 148 mg/dL — ABNORMAL HIGH (ref 70–99)
HCT: 42 % (ref 36.0–46.0)
Hemoglobin: 14.3 g/dL (ref 12.0–15.0)
Potassium: 3.2 mmol/L — ABNORMAL LOW (ref 3.5–5.1)
Sodium: 139 mmol/L (ref 135–145)
TCO2: 18 mmol/L (ref 0–100)

## 2014-11-18 LAB — CBC
HCT: 38.9 % (ref 36.0–46.0)
Hemoglobin: 13.7 g/dL (ref 12.0–15.0)
MCH: 29.2 pg (ref 26.0–34.0)
MCHC: 35.2 g/dL (ref 30.0–36.0)
MCV: 82.9 fL (ref 78.0–100.0)
Platelets: 288 10*3/uL (ref 150–400)
RBC: 4.69 MIL/uL (ref 3.87–5.11)
RDW: 13 % (ref 11.5–15.5)
WBC: 12.8 10*3/uL — ABNORMAL HIGH (ref 4.0–10.5)

## 2014-11-18 MED ORDER — HYDROMORPHONE HCL 1 MG/ML IJ SOLN
1.0000 mg | Freq: Once | INTRAMUSCULAR | Status: AC
  Administered 2014-11-18: 1 mg via INTRAVENOUS
  Filled 2014-11-18: qty 1

## 2014-11-18 MED ORDER — POTASSIUM CHLORIDE CRYS ER 20 MEQ PO TBCR
40.0000 meq | EXTENDED_RELEASE_TABLET | Freq: Once | ORAL | Status: AC
  Administered 2014-11-18: 40 meq via ORAL
  Filled 2014-11-18: qty 2

## 2014-11-18 NOTE — Telephone Encounter (Signed)
Spoke with patient. Advised patient of message as seen below from [place]. Patient is agreeable and will head to [facility] ER for evaluation.  Routing to provider for final review. Patient agreeable to disposition. Will close encounter

## 2014-11-18 NOTE — Discharge Instructions (Signed)
You may take motrin and your percocet as prescribed for pain.  See below for further instructions.

## 2014-11-18 NOTE — ED Provider Notes (Signed)
CSN: [id number]     Arrival date & time 11/18/14  1201 History   First MD Initiated Contact with Patient 11/18/14 1251     Chief Complaint  Patient presents with  . Leg Pain     (Consider location/radiation/quality/duration/timing/severity/associated sxs/prior Treatment) HPI Pt is a 39yo female sent to ED by her OB/GYN for further evaluation of left leg pain that started last night. Pt reports having a hysterectomy on December 8th and has been doing well until she started to develop severe left leg pain last night. Pt is unable to sit still due to severe pain. Pain is cramping and throbbing, 8/10 at worst, worse with ambulation and weight bearing.  No pain medication taken PTA. Denies any recent trauma to leg but does report back in October she had injured her left hamstring during a boot camp type workout.  States pain gradually improved on its own, she did not f/u with any specialist for that injury.  Denies increased swelling redness or warmth. Pt does report associated numbness and tingling in her left foot. Denies previous hx of blood clots. Denies chest pain or SOB.   Past Medical History  Diagnosis Date  . 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 Cervical biopsy  w/ loop electrode excision  06/03/05    HGSIL CIN II/CINIII  . Wisdom tooth extraction    . Tonsillectomy and adenoidectomy    . Abdominal hysterectomy N/A 10/21/2014    Procedure: HYSTERECTOMY ABDOMINAL with bilateral salpingectomy;  Surgeon: Jamey Reas de Berton Lan, MD;  Location: [facility];  Service: Gynecology;  Laterality: N/A;   Family History  Problem Relation Age of Onset  . Diabetes Mother     AODM  . Thyroid disease Mother     hypo  . Cancer Father     carcinoma in sinus cavity  . Diabetes Maternal Grandmother     AODM   History  Substance Use Topics  . Smoking status: Never Smoker   . Smokeless tobacco: Never Used  . Alcohol Use: 0.0  oz/week    0 Not specified per week     Comment: socially   OB History    Gravida Para Term Preterm AB TAB SAB Ectopic Multiple Living   1 0   1     0     Review of Systems  Constitutional: Negative for fever and chills.  Musculoskeletal: Positive for myalgias and arthralgias. Negative for neck pain and neck stiffness.       Left leg behind the knee and into calf  Skin: Negative for color change, pallor, rash and wound.  Neurological: Positive for numbness ( left lower leg and foot).  All other systems reviewed and are negative.     Allergies  Penicillins  Home Medications   Prior to Admission medications   Medication Sig Start Date End Date Taking? Authorizing Provider  Calcium-Magnesium-Vitamin D (CALCIUM 500 PO) Take by mouth daily.   Yes Historical Provider, MD  ibuprofen (ADVIL,MOTRIN) 600 MG tablet Take 1 tablet (600 mg total) by mouth every 6 (six) hours as needed (mild pain). 10/23/14  Yes [place], MD  Multiple Vitamin (MULTI-VITAMIN DAILY PO) Take by mouth daily.   Yes Historical Provider, MD  [facility]) GEL Apply 1 application topically.   Yes Historical Provider, MD  oxyCODONE-acetaminophen (PERCOCET/ROXICET) 5-325 MG per tablet Take 1-2 tablets  by mouth every 4 (four) hours as needed for severe pain (moderate to severe pain (when tolerating fluids)). Patient not taking: Reported on 11/18/2014 10/23/14   Brook E Amundson de Berton Lan, MD   BP 120/72 mmHg  Pulse 95  Temp(Src)   Resp 12  SpO2 100%  LMP 10/15/2014 (Approximate) Physical Exam  Constitutional: She appears well-developed and well-nourished. No distress.  HENT:  Head: Normocephalic and atraumatic.  Eyes: Conjunctivae are normal. No scleral icterus.  Neck: Normal range of motion.  Cardiovascular: Normal rate, regular rhythm and normal heart sounds.   Pulses:      Dorsalis pedis pulses are 2+ on the right side, and 2+ on the left side.        Posterior tibial pulses are 2+ on the right side, and 2+ on the left side.  Pulmonary/Chest: Effort normal and breath sounds normal. No respiratory distress. She has no wheezes. She has no rales. She exhibits no tenderness.  Abdominal: Soft. She exhibits no distension. There is no tenderness.  Musculoskeletal: Normal range of motion. She exhibits tenderness. She exhibits no edema.  Left lower extremity: tenderness behind left knee and calf. No edema. FROM left knee. Increased pain with full extension and flexion.   Neurological: She is alert.  Skin: Skin is dry. She is not diaphoretic. No erythema. There is pallor.  Skin in lower extremities cool to touch, left cooler than right. Skin in tact. No erythema.  Nursing note and vitals reviewed.   ED Course  Procedures (including critical care time) Labs Review Labs Reviewed  CBC - Abnormal; Notable for the following:    WBC 12.8 (*)    All other components within normal limits  I-STAT CHEM 8, ED - Abnormal; Notable for the following:    Potassium 3.2 (*)    Glucose, Bld 148 (*)    Calcium, Ion 1.10 (*)    All other components within normal limits    Imaging Review No results found.   EKG Interpretation None      MDM   Final diagnoses:  Left leg pain  Hypokalemia    Pt sent to ED by her OB/GYN for further evaluation of sudden onset left leg pain.  Pt had hysterectomy on Dec. 8th. Reports no complications, she has been doing well until today.  No hx of PE or DVTs.  Denies chest pain or SOB. On exam, pallor to bilateral lower extremities, skin cool to touch, cooler in Left lower extremity. Strong pedal pulses. No erythema or edema.  Venous doppler: negative for DVT or Baker's Cyst.  Pain resolved with 1mg  dilauaid.  Labs: significant for hypokalemia.  Will give 28mEq K-dur.    Pt states she did injury her left hamstring in October but that pain had resolved on its own. Pain today may have been to reinjury of same muscle a few  months ago.  Will refer to [facility] to establish care with a PCP as well as Dr. Lorin Mercy, orthopedics, for further evaluation and treatment of left leg pain.      Noland Fordyce, PA-C 11/18/14 [place], MD 11/19/14 [phone]

## 2014-11-18 NOTE — Telephone Encounter (Addendum)
Spoke with patient. Patient had hysterectomy on 10/21/14 with Dr.Silva. "I had a hamstring problem before my surgery but I haven't had problems since. Last night I started to have pain behind my knee and the back of my thigh. It hurts when I am standing too much. When I bend my leg it is tight and painful." Denies any color changes, swelling, or warmth to leg. Has not performed any exercise or heavy lifting that may have hurt extremity. Denies shortness of breath, weakness, chest pain or tightness. "I did drive yesterday but I do not think that would be a problem because I am able to drive now." Advised patient will need to speak with provider and return call with further recommendations for evaluation. Patient is agreeable.  Dr.Silva, would you like patient to be seen today in office with Dr.Miller? Or send for imaging?

## 2014-11-18 NOTE — Telephone Encounter (Signed)
Patient calling stating, "I am having pain behind my leg and I am concerned because I just had surgery." Please advise?

## 2014-11-18 NOTE — Telephone Encounter (Signed)
Patient does not have a DVT.

## 2014-11-18 NOTE — ED Notes (Signed)
Pt made aware to return if symptoms worsen or if any life threatening symptoms occur.   

## 2014-11-18 NOTE — ED Notes (Signed)
Pt reports left posterior knee pain since last night.  Pt had hysterectomy dec 8th.  Pt in obvious pain, unable to sit still.  Pt denies cp/sob. All pulses intact.

## 2014-11-18 NOTE — ED Notes (Addendum)
Pt had hysterectomy dec 8th and now has left pain behind knee and calf. NO redness of swelling noted but pt visibly uncomfortable. Called MD and was told to come here. Strong pedal pulse. Pt report numb and tingling. Skin feels cool. Pt reports putting pressure or weight is very painful. Lying flat makes pain better. No chest pain or SOB.

## 2014-11-18 NOTE — Progress Notes (Signed)
Left lower extremity venous duplex completed.  Left:  No evidence of DVT, superficial thrombosis, or Baker's cyst.  Right:  Negative for DVT in the common femoral vein.  

## 2014-11-18 NOTE — Telephone Encounter (Signed)
I would send to [facility] ER for evaluation and imaging.   Cc- Lamont Snowball

## 2014-11-19 NOTE — ED Provider Notes (Signed)
Medical screening examination/treatment/procedure(s) were conducted as a shared visit with non-physician practitioner(s) and myself.  I personally evaluated the patient during the encounter.   EKG Interpretation None      39 year old female presenting with severe left posterior leg pain. Described as a severe cramping pain. She has had an injury to this leg in the recent past, but it has been getting better until today.  She discussed her symptoms with her gynecologist and was told to come the emergency department for further evaluation. On exam, well appearing, nontoxic, not distressed, normal respiratory effort, normal perfusion, tenderness palpation of for hamstring without redness, swelling, masses. Neurovascular intact distally. An ultrasound has ruled out DVT. I suspect her pain was from a muscle cramp. Plan discharge home with further outpatient treatment.  Clinical Impression: 1. Left leg pain   2. Hypokalemia       Houston Siren III, MD 11/19/14 [phone]

## 2014-11-21 ENCOUNTER — Emergency Department (INDEPENDENT_AMBULATORY_CARE_PROVIDER_SITE_OTHER)
Admission: EM | Admit: 2014-11-21 | Discharge: 2014-11-21 | Disposition: A | Discharge status: Home / Self Care | Payer: 59 | Source: Home / Self Care

## 2014-11-21 ENCOUNTER — Encounter (HOSPITAL_COMMUNITY): Payer: Self-pay | Admitting: Emergency Medicine

## 2014-11-21 DIAGNOSIS — S9002XA Contusion of left ankle, initial encounter: Secondary | ICD-10-CM

## 2014-11-21 DIAGNOSIS — M5432 Sciatica, left side: Secondary | ICD-10-CM

## 2014-11-21 DIAGNOSIS — M461 Sacroiliitis, not elsewhere classified: Secondary | ICD-10-CM

## 2014-11-21 MED ORDER — DICLOFENAC SODIUM 1 % TD GEL: 2.0000 g | Freq: Four times a day (QID) | TRANSDERMAL | Status: DC

## 2014-11-21 MED ORDER — PREDNISONE 10 MG PO KIT: PACK | ORAL | Status: DC

## 2014-11-21 NOTE — Discharge Instructions (Signed)
You likely have sciatica Please start the prednisone and exercises Please use the voltaren gel for the ankle pain Please also to the ankle strengthening  Exercises   Sciatica with Rehab The sciatic nerve runs from the back down the leg and is responsible for sensation and control of the muscles in the back (posterior) side of the thigh, lower leg, and foot. Sciatica is a condition that is characterized by inflammation of this nerve.  SYMPTOMS   Signs of nerve damage, including numbness and/or weakness along the posterior side of the lower extremity.  Pain in the back of the thigh that may also travel down the leg.  Pain that worsens when sitting for long periods of time.  Occasionally, pain in the back or buttock. CAUSES  Inflammation of the sciatic nerve is the cause of sciatica. The inflammation is due to something irritating the nerve. Common sources of irritation include:  Sitting for long periods of time.  Direct trauma to the nerve.  Arthritis of the spine.  Herniated or ruptured disk.  Slipping of the vertebrae (spondylolisthesis).  Pressure from soft tissues, such as muscles or ligament-like tissue (fascia). RISK INCREASES WITH:  Sports that place pressure or stress on the spine (football or weightlifting).  Poor strength and flexibility.  Failure to warm up properly before activity.  Family history of low back pain or disk disorders.  Previous back injury or surgery.  Poor body mechanics, especially when lifting, or poor posture. PREVENTION   Warm up and stretch properly before activity.  Maintain physical fitness:  Strength, flexibility, and endurance.  Cardiovascular fitness.  Learn and use proper technique, especially with posture and lifting. When possible, have coach correct improper technique.  Avoid activities that place stress on the spine. PROGNOSIS If treated properly, then sciatica usually resolves within 6 weeks. However, occasionally  surgery is necessary.  RELATED COMPLICATIONS   Permanent nerve damage, including pain, numbness, tingle, or weakness.  Chronic back pain.  Risks of surgery: infection, bleeding, nerve damage, or damage to surrounding tissues. TREATMENT Treatment initially involves resting from any activities that aggravate your symptoms. The use of ice and medication may help reduce pain and inflammation. The use of strengthening and stretching exercises may help reduce pain with activity. These exercises may be performed at home or with referral to a therapist. A therapist may recommend further treatments, such as transcutaneous electronic nerve stimulation (TENS) or ultrasound. Your caregiver may recommend corticosteroid injections to help reduce inflammation of the sciatic nerve. If symptoms persist despite non-surgical (conservative) treatment, then surgery may be recommended. MEDICATION  If pain medication is necessary, then nonsteroidal anti-inflammatory medications, such as aspirin and ibuprofen, or other minor pain relievers, such as acetaminophen, are often recommended.  Do not take pain medication for 7 days before surgery.  Prescription pain relievers may be given if deemed necessary by your caregiver. Use only as directed and only as much as you need.  Ointments applied to the skin may be helpful.  Corticosteroid injections may be given by your caregiver. These injections should be reserved for the most serious cases, because they may only be given a certain number of times. HEAT AND COLD  Cold treatment (icing) relieves pain and reduces inflammation. Cold treatment should be applied for 10 to 15 minutes every 2 to 3 hours for inflammation and pain and immediately after any activity that aggravates your symptoms. Use ice packs or massage the area with a piece of ice (ice massage).  Heat treatment may be  used prior to performing the stretching and strengthening activities prescribed by your  caregiver, physical therapist, or athletic trainer. Use a heat pack or soak the injury in warm water. SEEK MEDICAL CARE IF:  Treatment seems to offer no benefit, or the condition worsens.  Any medications produce adverse side effects. EXERCISES  RANGE OF MOTION (ROM) AND STRETCHING EXERCISES - Sciatica Most people with sciatic will find that their symptoms worsen with either excessive bending forward (flexion) or arching at the low back (extension). The exercises which will help resolve your symptoms will focus on the opposite motion. Your physician, physical therapist or athletic trainer will help you determine which exercises will be most helpful to resolve your low back pain. Do not complete any exercises without first consulting with your clinician. Discontinue any exercises which worsen your symptoms until you speak to your clinician. If you have pain, numbness or tingling which travels down into your buttocks, leg or foot, the goal of the therapy is for these symptoms to move closer to your back and eventually resolve. Occasionally, these leg symptoms will get better, but your low back pain may worsen; this is typically an indication of progress in your rehabilitation. Be certain to be very alert to any changes in your symptoms and the activities in which you participated in the 24 hours prior to the change. Sharing this information with your clinician will allow him/her to most efficiently treat your condition. These exercises may help you when beginning to rehabilitate your injury. Your symptoms may resolve with or without further involvement from your physician, physical therapist or athletic trainer. While completing these exercises, remember:   Restoring tissue flexibility helps normal motion to return to the joints. This allows healthier, less painful movement and activity.  An effective stretch should be held for at least 30 seconds.  A stretch should never be painful. You should only  feel a gentle lengthening or release in the stretched tissue. FLEXION RANGE OF MOTION AND STRETCHING EXERCISES: STRETCH - Flexion, Single Knee to Chest   Lie on a firm bed or floor with both legs extended in front of you.  Keeping one leg in contact with the floor, bring your opposite knee to your chest. Hold your leg in place by either grabbing behind your thigh or at your knee.  Pull until you feel a gentle stretch in your low back. Hold __________ seconds.  Slowly release your grasp and repeat the exercise with the opposite side. Repeat __________ times. Complete this exercise __________ times per day.  STRETCH - Flexion, Double Knee to Chest  Lie on a firm bed or floor with both legs extended in front of you.  Keeping one leg in contact with the floor, bring your opposite knee to your chest.  Tense your stomach muscles to support your back and then lift your other knee to your chest. Hold your legs in place by either grabbing behind your thighs or at your knees.  Pull both knees toward your chest until you feel a gentle stretch in your low back. Hold __________ seconds.  Tense your stomach muscles and slowly return one leg at a time to the floor. Repeat __________ times. Complete this exercise __________ times per day.  STRETCH - Low Trunk Rotation   Lie on a firm bed or floor. Keeping your legs in front of you, bend your knees so they are both pointed toward the ceiling and your feet are flat on the floor.  Extend your arms out to  the side. This will stabilize your upper body by keeping your shoulders in contact with the floor.  Gently and slowly drop both knees together to one side until you feel a gentle stretch in your low back. Hold for __________ seconds.  Tense your stomach muscles to support your low back as you bring your knees back to the starting position. Repeat the exercise to the other side. Repeat __________ times. Complete this exercise __________ times per day    EXTENSION RANGE OF MOTION AND FLEXIBILITY EXERCISES: STRETCH - Extension, Prone on Elbows  Lie on your stomach on the floor, a bed will be too soft. Place your palms about shoulder width apart and at the height of your head.  Place your elbows under your shoulders. If this is too painful, stack pillows under your chest.  Allow your body to relax so that your hips drop lower and make contact more completely with the floor.  Hold this position for __________ seconds.  Slowly return to lying flat on the floor. Repeat __________ times. Complete this exercise __________ times per day.  RANGE OF MOTION - Extension, Prone Press Ups  Lie on your stomach on the floor, a bed will be too soft. Place your palms about shoulder width apart and at the height of your head.  Keeping your back as relaxed as possible, slowly straighten your elbows while keeping your hips on the floor. You may adjust the placement of your hands to maximize your comfort. As you gain motion, your hands will come more underneath your shoulders.  Hold this position __________ seconds.  Slowly return to lying flat on the floor. Repeat __________ times. Complete this exercise __________ times per day.  STRENGTHENING EXERCISES - Sciatica  These exercises may help you when beginning to rehabilitate your injury. These exercises should be done near your "sweet spot." This is the neutral, low-back arch, somewhere between fully rounded and fully arched, that is your least painful position. When performed in this safe range of motion, these exercises can be used for people who have either a flexion or extension based injury. These exercises may resolve your symptoms with or without further involvement from your physician, physical therapist or athletic trainer. While completing these exercises, remember:   Muscles can gain both the endurance and the strength needed for everyday activities through controlled exercises.  Complete these  exercises as instructed by your physician, physical therapist or athletic trainer. Progress with the resistance and repetition exercises only as your caregiver advises.  You may experience muscle soreness or fatigue, but the pain or discomfort you are trying to eliminate should never worsen during these exercises. If this pain does worsen, stop and make certain you are following the directions exactly. If the pain is still present after adjustments, discontinue the exercise until you can discuss the trouble with your clinician. STRENGTHENING - Deep Abdominals, Pelvic Tilt   Lie on a firm bed or floor. Keeping your legs in front of you, bend your knees so they are both pointed toward the ceiling and your feet are flat on the floor.  Tense your lower abdominal muscles to press your low back into the floor. This motion will rotate your pelvis so that your tail bone is scooping upwards rather than pointing at your feet or into the floor.  With a gentle tension and even breathing, hold this position for __________ seconds. Repeat __________ times. Complete this exercise __________ times per day.  STRENGTHENING - Abdominals, Crunches   Lie on  a firm bed or floor. Keeping your legs in front of you, bend your knees so they are both pointed toward the ceiling and your feet are flat on the floor. Cross your arms over your chest.  Slightly tip your chin down without bending your neck.  Tense your abdominals and slowly lift your trunk high enough to just clear your shoulder blades. Lifting higher can put excessive stress on the low back and does not further strengthen your abdominal muscles.  Control your return to the starting position. Repeat __________ times. Complete this exercise __________ times per day.  STRENGTHENING - Quadruped, Opposite UE/LE Lift  Assume a hands and knees position on a firm surface. Keep your hands under your shoulders and your knees under your hips. You may place padding under  your knees for comfort.  Find your neutral spine and gently tense your abdominal muscles so that you can maintain this position. Your shoulders and hips should form a rectangle that is parallel with the floor and is not twisted.  Keeping your trunk steady, lift your right hand no higher than your shoulder and then your left leg no higher than your hip. Make sure you are not holding your breath. Hold this position __________ seconds.  Continuing to keep your abdominal muscles tense and your back steady, slowly return to your starting position. Repeat with the opposite arm and leg. Repeat __________ times. Complete this exercise __________ times per day.  STRENGTHENING - Abdominals and Quadriceps, Straight Leg Raise   Lie on a firm bed or floor with both legs extended in front of you.  Keeping one leg in contact with the floor, bend the other knee so that your foot can rest flat on the floor.  Find your neutral spine, and tense your abdominal muscles to maintain your spinal position throughout the exercise.  Slowly lift your straight leg off the floor about 6 inches for a count of 15, making sure to not hold your breath.  Still keeping your neutral spine, slowly lower your leg all the way to the floor. Repeat this exercise with each leg __________ times. Complete this exercise __________ times per day. POSTURE AND BODY MECHANICS CONSIDERATIONS - Sciatica Keeping correct posture when sitting, standing or completing your activities will reduce the stress put on different body tissues, allowing injured tissues a chance to heal and limiting painful experiences. The following are general guidelines for improved posture. Your physician or physical therapist will provide you with any instructions specific to your needs. While reading these guidelines, remember:  The exercises prescribed by your provider will help you have the flexibility and strength to maintain correct postures.  The correct posture  provides the optimal environment for your joints to work. All of your joints have less wear and tear when properly supported by a spine with good posture. This means you will experience a healthier, less painful body.  Correct posture must be practiced with all of your activities, especially prolonged sitting and standing. Correct posture is as important when doing repetitive low-stress activities (typing) as it is when doing a single heavy-load activity (lifting). RESTING POSITIONS Consider which positions are most painful for you when choosing a resting position. If you have pain with flexion-based activities (sitting, bending, stooping, squatting), choose a position that allows you to rest in a less flexed posture. You would want to avoid curling into a fetal position on your side. If your pain worsens with extension-based activities (prolonged standing, working overhead), avoid resting in an  extended position such as sleeping on your stomach. Most people will find more comfort when they rest with their spine in a more neutral position, neither too rounded nor too arched. Lying on a non-sagging bed on your side with a pillow between your knees, or on your back with a pillow under your knees will often provide some relief. Keep in mind, being in any one position for a prolonged period of time, no matter how correct your posture, can still lead to stiffness. PROPER SITTING POSTURE In order to minimize stress and discomfort on your spine, you must sit with correct posture Sitting with good posture should be effortless for a healthy body. Returning to good posture is a gradual process. Many people can work toward this most comfortably by using various supports until they have the flexibility and strength to maintain this posture on their own. When sitting with proper posture, your ears will fall over your shoulders and your shoulders will fall over your hips. You should use the back of the chair to support  your upper back. Your low back will be in a neutral position, just slightly arched. You may place a small pillow or folded towel at the base of your low back for support.  When working at a desk, create an environment that supports good, upright posture. Without extra support, muscles fatigue and lead to excessive strain on joints and other tissues. Keep these recommendations in mind: CHAIR:   A chair should be able to slide under your desk when your back makes contact with the back of the chair. This allows you to work closely.  The chair's height should allow your eyes to be level with the upper part of your monitor and your hands to be slightly lower than your elbows. BODY POSITION  Your feet should make contact with the floor. If this is not possible, use a foot rest.  Keep your ears over your shoulders. This will reduce stress on your neck and low back. INCORRECT SITTING POSTURES   If you are feeling tired and unable to assume a healthy sitting posture, do not slouch or slump. This puts excessive strain on your back tissues, causing more damage and pain. Healthier options include:  Using more support, like a lumbar pillow.  Switching tasks to something that requires you to be upright or walking.  Talking a brief walk.  Lying down to rest in a neutral-spine position. PROLONGED STANDING WHILE SLIGHTLY LEANING FORWARD  When completing a task that requires you to lean forward while standing in one place for a long time, place either foot up on a stationary 2-4 inch high object to help maintain the best posture. When both feet are on the ground, the low back tends to lose its slight inward curve. If this curve flattens (or becomes too large), then the back and your other joints will experience too much stress, fatigue more quickly and can cause pain.  CORRECT STANDING POSTURES Proper standing posture should be assumed with all daily activities, even if they only take a few moments, like  when brushing your teeth. As in sitting, your ears should fall over your shoulders and your shoulders should fall over your hips. You should keep a slight tension in your abdominal muscles to brace your spine. Your tailbone should point down to the ground, not behind your body, resulting in an over-extended swayback posture.  INCORRECT STANDING POSTURES  Common incorrect standing postures include a forward head, locked knees and/or an excessive swayback.  WALKING Walk with an upright posture. Your ears, shoulders and hips should all line-up. PROLONGED ACTIVITY IN A FLEXED POSITION When completing a task that requires you to bend forward at your waist or lean over a low surface, try to find a way to stabilize 3 of 4 of your limbs. You can place a hand or elbow on your thigh or rest a knee on the surface you are reaching across. This will provide you more stability so that your muscles do not fatigue as quickly. By keeping your knees relaxed, or slightly bent, you will also reduce stress across your low back. CORRECT LIFTING TECHNIQUES DO :   Assume a wide stance. This will provide you more stability and the opportunity to get as close as possible to the object which you are lifting.  Tense your abdominals to brace your spine; then bend at the knees and hips. Keeping your back locked in a neutral-spine position, lift using your leg muscles. Lift with your legs, keeping your back straight.  Test the weight of unknown objects before attempting to lift them.  Try to keep your elbows locked down at your sides in order get the best strength from your shoulders when carrying an object.  Always ask for help when lifting heavy or awkward objects. INCORRECT LIFTING TECHNIQUES DO NOT:   Lock your knees when lifting, even if it is a small object.  Bend and twist. Pivot at your feet or move your feet when needing to change directions.  Assume that you cannot safely pick up a paperclip without proper  posture. Document Released: 10/31/2005 Document Revised: 03/17/2014 Document Reviewed: 02/12/2009 [facility] Patient Information 2015 [place], [place]. This information is not intended to replace advice given to you by your health care provider. Make sure you discuss any questions you have with your health care provider.
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
## 2014-11-21 NOTE — ED Notes (Signed)
Pt reports pain from left hip down to lower left leg onset 3 days Reports she was seen at [facility] ED on 11/18/14 for similar sx Had a hysterectomy on 10/21/14 Also reports numbness and tingly of foot Steady gait; NAD Alert, no signs of acute distress.

## 2014-12-03 ENCOUNTER — Encounter: Payer: Self-pay | Admitting: Obstetrics and Gynecology

## 2014-12-03 ENCOUNTER — Ambulatory Visit (INDEPENDENT_AMBULATORY_CARE_PROVIDER_SITE_OTHER): Payer: 59 | Admitting: Obstetrics and Gynecology

## 2014-12-03 VITALS — BP 110/70 | HR 90 | Ht 62.5 in | Wt 140.2 lb

## 2014-12-03 DIAGNOSIS — Z90710 Acquired absence of both cervix and uterus: Secondary | ICD-10-CM

## 2014-12-03 NOTE — Progress Notes (Signed)
Patient ID: Nichole Mcgee, female   DOB: 1976-07-09, 39 y.o.   MRN: [id number] GYNECOLOGY  VISIT   HPI: 39 y.o.   Married  Caucasian  female   G1P0010 with Patient's last menstrual period was 10/15/2014 (approximate).   here for 6 weeks post Total abdominal hysterectomy with bilateral salpingectomy.   Had an episode of left leg sciatica and was sent to [facility]. No evidence of a DVT - Had negative doppler ultrasound.  Went a few days later to urgent care and was treated with steroids for the sciatica.  Seeing physical therapy.  Had her left foot caught in wheelchair she she was at [facility] and had deep bruising of left foot.   Feels good from the standpoint of hysterectomy.  No bleeding, pain or problems.   GYNECOLOGIC HISTORY: Patient's last menstrual period was 10/15/2014 (approximate). Contraception:  hysterectomy  Menopausal hormone therapy: none        OB History    Gravida Para Term Preterm AB TAB SAB Ectopic Multiple Living   1 0   1     0         Patient Active Problem List   Diagnosis Date Noted  . Status post total abdominal hysterectomy 10/21/2014  . Uterine fibroid 07/16/2014    Past Medical History  Diagnosis Date  . 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 Cervical biopsy  w/ loop electrode excision  06/03/05    HGSIL CIN II/CINIII  . Wisdom tooth extraction    . Tonsillectomy and adenoidectomy    . Abdominal hysterectomy N/A 10/21/2014    Procedure: HYSTERECTOMY ABDOMINAL with bilateral salpingectomy;  Surgeon: Jamey Reas de Berton Lan, MD;  Location: [facility];  Service: Gynecology;  Laterality: N/A;    Current Outpatient Prescriptions  Medication Sig Dispense Refill  . Calcium-Magnesium-Vitamin D (CALCIUM 500 PO) Take by mouth daily.    . diclofenac sodium (VOLTAREN) 1 % GEL Apply 2-4 g topically 4 (four) times daily. 100 g 0  . Multiple Vitamin (MULTI-VITAMIN DAILY PO) Take by  mouth daily.    . Scar Treatment Products (MEDERMA) GEL Apply 1 application topically.     No current facility-administered medications for this visit.     ALLERGIES: Penicillins  Family History  Problem Relation Age of Onset  . Diabetes Mother     AODM  . Thyroid disease Mother     hypo  . Cancer Father     carcinoma in sinus cavity  . Diabetes Maternal Grandmother     AODM    History   Social History  . Marital Status: Married    Spouse Name: N/A    Number of Children: N/A  . Years of Education: N/A   Occupational History  . Not on file.   Social History Main Topics  . Smoking status: Never Smoker   . Smokeless tobacco: Never Used  . Alcohol Use: 0.0 oz/week    0 Not specified per week     Comment: socially  . Drug Use: No  . Sexual Activity:    Partners: Male    Birth Control/ Protection: Surgical     Comment: TAH/BSO   Other Topics Concern  . Not on file   Social History Narrative    ROS:  Pertinent items are noted in HPI.  PHYSICAL EXAMINATION:    BP 110/70 mmHg  Pulse 90  Ht 5'  2.5" (1.588 m)  Wt 140 lb 3.2 oz (63.594 kg)  BMI 25.22 kg/m2  LMP 10/15/2014 (Approximate)     General appearance: alert, cooperative and appears stated age   Abdomen: Pfannenstiel incision intact - 4 areas of minor separation of incision (3 mm each) and Vicryl suture just barely visible in the subcuticular closure. Incision cleansed with H2O2 and suture pieces removed - dissolved with touching.  Areas dressed. Soft, non-tender; no masses,  no organomegaly No abnormal inguinal nodes palpated Left foot with small orthopedic boot.   Pelvic: External genitalia:  no lesions              Urethra:  normal appearing urethra with no masses, tenderness or lesions              Bartholins and Skenes: normal                 Vagina: normal appearing vagina with normal color and discharge, no lesions, intact cuff.              Cervix: absent                   Bimanual Exam:   Uterus:  absent                                      Adnexa: normal adnexa in size, nontender and no masses                                       ASSESSMENT  Status post TAH/bilateral salpingectomy.  Doing well.   PLAN  Clean incision with H2O2, antibacterial soap and water.  Return to normal activities. Return for annual exams and prn.     An After Visit Summary was printed and given to the patient.  __20____ minutes face to face time of which over 50% was spent in counseling.

## 2014-12-04 ENCOUNTER — Encounter: Payer: Self-pay | Admitting: Obstetrics and Gynecology

## 2015-07-24 ENCOUNTER — Ambulatory Visit (INDEPENDENT_AMBULATORY_CARE_PROVIDER_SITE_OTHER): Payer: Commercial Managed Care - HMO | Admitting: Obstetrics and Gynecology

## 2015-07-24 ENCOUNTER — Encounter: Payer: Self-pay | Admitting: Obstetrics and Gynecology

## 2015-07-24 VITALS — BP 100/68 | HR 76 | Resp 20 | Ht 62.5 in | Wt 152.8 lb

## 2015-07-24 DIAGNOSIS — Z01419 Encounter for gynecological examination (general) (routine) without abnormal findings: Secondary | ICD-10-CM

## 2015-07-24 DIAGNOSIS — Z0000 Encounter for general adult medical examination without abnormal findings: Secondary | ICD-10-CM | POA: Diagnosis not present

## 2015-07-24 DIAGNOSIS — R635 Abnormal weight gain: Secondary | ICD-10-CM

## 2015-07-24 LAB — HEMOGLOBIN A1C
HEMOGLOBIN A1C: 5.2 % (ref <5.7)
MEAN PLASMA GLUCOSE: 103 mg/dL (ref <117)

## 2015-07-24 LAB — COMPREHENSIVE METABOLIC PANEL
ALBUMIN: 4.3 g/dL (ref 3.6–5.1)
ALT: 15 U/L (ref 6–29)
AST: 19 U/L (ref 10–30)
Alkaline Phosphatase: 67 U/L (ref 33–115)
BUN: 12 mg/dL (ref 7–25)
CHLORIDE: 101 mmol/L (ref 98–110)
CO2: 27 mmol/L (ref 20–31)
Calcium: 9.3 mg/dL (ref 8.6–10.2)
Creat: 0.71 mg/dL (ref 0.50–1.10)
Glucose, Bld: 91 mg/dL (ref 65–99)
Potassium: 4.5 mmol/L (ref 3.5–5.3)
Sodium: 138 mmol/L (ref 135–146)
TOTAL PROTEIN: 7.1 g/dL (ref 6.1–8.1)
Total Bilirubin: 0.5 mg/dL (ref 0.2–1.2)

## 2015-07-24 LAB — CBC
HCT: 40 % (ref 36.0–46.0)
Hemoglobin: 13.6 g/dL (ref 12.0–15.0)
MCH: 29.6 pg (ref 26.0–34.0)
MCHC: 34 g/dL (ref 30.0–36.0)
MCV: 87.1 fL (ref 78.0–100.0)
MPV: 9.1 fL (ref 8.6–12.4)
Platelets: 328 10*3/uL (ref 150–400)
RBC: 4.59 MIL/uL (ref 3.87–5.11)
RDW: 13.8 % (ref 11.5–15.5)
WBC: 10.5 10*3/uL (ref 4.0–10.5)

## 2015-07-24 LAB — POCT URINALYSIS DIPSTICK
BILIRUBIN UA: NEGATIVE
Blood, UA: NEGATIVE
Glucose, UA: NEGATIVE
Ketones, UA: NEGATIVE
LEUKOCYTES UA: NEGATIVE
NITRITE UA: NEGATIVE
PH UA: 6
Protein, UA: NEGATIVE
Urobilinogen, UA: NEGATIVE

## 2015-07-24 LAB — LIPID PANEL
CHOL/HDL RATIO: 5.4 ratio — AB (ref <=5.0)
Cholesterol: 178 mg/dL (ref 125–200)
HDL: 33 mg/dL — AB (ref >=46)
LDL CALC: 115 mg/dL (ref <130)
Triglycerides: 149 mg/dL (ref <150)
VLDL: 30 mg/dL (ref <30)

## 2015-07-24 LAB — TSH: TSH: 3.112 u[IU]/mL (ref 0.350–4.500)

## 2015-07-24 NOTE — Patient Instructions (Signed)

## 2015-07-24 NOTE — Progress Notes (Signed)
Patient ID: Nichole Mcgee, female   DOB: January 29, 1976, 39 y.o.   MRN: [id number] 39 y.o. G51P0010 Married Caucasian female here for annual exam.    Some increased hair on chin and weight gain.  Frustrated. Plucking hair from her chin.  Not exercising. Gained 10 - 12 pounds since hysterectomy. Was on OCPs prior to hysterectomy.  No known Hx of PCOS.   Husband's family coming to visit in October for husband's birthday.  They travel in an RV.  PCP:  None  Patient's last menstrual period was 10/15/2014 (approximate).          Sexually active: Yes.  female partner  The current method of family planning is status post hysterectomy.    Exercising: Yes.    yoga,weights and cardio. 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 She has never used smokeless tobacco. She reports that she does not drink alcohol or use illicit drugs.  Past Medical History  Diagnosis Date  . 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 Cervical biopsy  w/ loop electrode excision  06/03/05    HGSIL CIN II/CINIII  . Wisdom tooth extraction    . Tonsillectomy and adenoidectomy    . Abdominal hysterectomy N/A 10/21/2014    Procedure: HYSTERECTOMY ABDOMINAL with bilateral salpingectomy;  Surgeon: Jamey Reas de Berton Lan, MD;  Location: [facility];  Service: Gynecology;  Laterality: N/A;    Current Outpatient Prescriptions  Medication Sig Dispense Refill  . Calcium-Magnesium-Vitamin D (CALCIUM 500 PO) Take by mouth daily.    . Multiple Vitamin (MULTI-VITAMIN DAILY PO) Take by mouth daily.     No current facility-administered medications for this visit.    Family History  Problem Relation Age of Onset  . Diabetes Mother      AODM  . Thyroid disease Mother     hypo  . Cancer Father     carcinoma in sinus cavity  . Diabetes Maternal Grandmother     AODM    ROS:  Pertinent items are noted in HPI.  Otherwise, a comprehensive ROS was negative.  Exam:   BP 100/68 mmHg  Pulse 76  Resp 20  Ht 5' 2.5" (1.588 m)  Wt 152 lb 12.8 oz (69.31 kg)  BMI 27.48 kg/m2  LMP 10/15/2014 (Approximate)    General appearance: alert, cooperative and appears stated age Head: Normocephalic, without obvious abnormality, atraumatic Neck: no adenopathy, supple, symmetrical, trachea midline and thyroid normal to inspection and palpation Lungs: clear to auscultation bilaterally Breasts: normal appearance, no masses or tenderness, Inspection negative, No nipple retraction or dimpling, No nipple discharge or bleeding, No axillary or supraclavicular adenopathy Heart: regular rate and rhythm Abdomen: soft, non-tender; bowel sounds normal; no masses,  no organomegaly Extremities: extremities normal, atraumatic, no cyanosis or edema Skin: Skin color, texture, turgor normal. No rashes or lesions Lymph nodes: Cervical, supraclavicular, and axillary nodes normal. No abnormal inguinal nodes palpated Neurologic: Grossly normal  Pelvic: External genitalia:  no lesions              Urethra:  normal appearing urethra with no masses, tenderness or lesions  Bartholins and Skenes: normal                 Vagina: normal appearing vagina with normal color and discharge, no lesions              Cervix: absent              Pap taken: Yes.   Bimanual Exam:  Uterus:  uterus absent              Adnexa: normal adnexa and no mass, fullness, tenderness              Rectovaginal: Yes.  .  Confirms.              Anus:  normal sphincter tone, no lesions  Chaperone was present for exam.  Assessment:   Well woman visit with normal exam. 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  Status post TAH/bilateral salpingectomy for fibroids.  Weight gain.    Slight increase in facial hair.   Plan: Yearly mammogram recommended after age 72.  Patient will schedule at [facility].  Contact information given.  Recommended self breast exam.  Pap and HR HPV as above. Discussed Calcium, Vitamin D, regular exercise program including cardiovascular and weight bearing exercise. Labs performed.  Yes.  .   See orders.  Routine labs and TSH, prolactin, LH, FSH, testosterone, HgbA1C. Discussed weight loss through dietician, increased exercise, and organized program of weight loss such as [facility].  Refills given on medications.  No..    Follow up annually and prn.      After visit summary provided.

## 2015-07-25 LAB — FSH/LH
FSH: 13.5 m[IU]/mL
LH: 45.5 m[IU]/mL

## 2015-07-25 LAB — PROLACTIN: PROLACTIN: 27.6 ng/mL

## 2015-07-25 LAB — TESTOSTERONE: Testosterone: 44 ng/dL (ref 10–70)

## 2015-07-27 LAB — HEMOGLOBIN, FINGERSTICK: Hemoglobin, fingerstick: 12.7 g/dL (ref 12.0–16.0)

## 2015-07-29 LAB — IPS PAP TEST WITH HPV

## 2015-11-15 DIAGNOSIS — E785 Hyperlipidemia, unspecified: Secondary | ICD-10-CM

## 2015-11-15 DIAGNOSIS — R7989 Other specified abnormal findings of blood chemistry: Secondary | ICD-10-CM

## 2015-11-15 HISTORY — DX: Hyperlipidemia, unspecified: E78.5

## 2015-11-15 HISTORY — DX: Other specified abnormal findings of blood chemistry: R79.89

## 2016-02-09 ENCOUNTER — Ambulatory Visit (INDEPENDENT_AMBULATORY_CARE_PROVIDER_SITE_OTHER): Payer: Commercial Managed Care - HMO | Admitting: Obstetrics and Gynecology

## 2016-02-09 ENCOUNTER — Encounter: Payer: Self-pay | Admitting: Obstetrics and Gynecology

## 2016-02-09 VITALS — BP 98/70 | HR 80 | Resp 14 | Wt 151.0 lb

## 2016-02-09 DIAGNOSIS — B3731 Acute candidiasis of vulva and vagina: Secondary | ICD-10-CM

## 2016-02-09 DIAGNOSIS — B373 Candidiasis of vulva and vagina: Secondary | ICD-10-CM | POA: Diagnosis not present

## 2016-02-09 DIAGNOSIS — N898 Other specified noninflammatory disorders of vagina: Secondary | ICD-10-CM

## 2016-02-09 DIAGNOSIS — N949 Unspecified condition associated with female genital organs and menstrual cycle: Secondary | ICD-10-CM

## 2016-02-09 LAB — POCT URINALYSIS DIPSTICK
BILIRUBIN UA: NEGATIVE
Glucose, UA: NEGATIVE
Ketones, UA: NEGATIVE
NITRITE UA: NEGATIVE
PH UA: 5
PROTEIN UA: NEGATIVE
Urobilinogen, UA: NEGATIVE

## 2016-02-09 MED ORDER — FLUCONAZOLE 150 MG PO TABS: ORAL_TABLET | ORAL | Status: DC

## 2016-02-09 NOTE — Progress Notes (Signed)
Patient ID: Nichole Mcgee, female   DOB: 26-Jun-1976, 40 y.o.   MRN: [id number] GYNECOLOGY  VISIT   HPI: 40 y.o.   Married  Caucasian  female   G1P0010 with Patient's last menstrual period was 10/15/2014 (approximate).   here c/o vaginal discharge, itching and burning x 3 days. The pruritus is mild. The d/c is whitish/yellow and cream. No odor. No recent antibiotics. No urinary c/o.   GYNECOLOGIC HISTORY: Patient's last menstrual period was 10/15/2014 (approximate). Contraception:hysterectomy  Menopausal hormone therapy: none         OB History    Gravida Para Term Preterm AB TAB SAB Ectopic Multiple Living   1 0   1     0         Patient Active Problem List   Diagnosis Date Noted  . Status post total abdominal hysterectomy 10/21/2014  . Uterine fibroid 07/16/2014    Past Medical History  Diagnosis Date  . 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 Cervical biopsy  w/ loop electrode excision  06/03/05    HGSIL CIN II/CINIII  . Wisdom tooth extraction    . Tonsillectomy and adenoidectomy    . Abdominal hysterectomy N/A 10/21/2014    Procedure: HYSTERECTOMY ABDOMINAL with bilateral salpingectomy;  Surgeon: Jamey Reas de Berton Lan, MD;  Location: [facility];  Service: Gynecology;  Laterality: N/A;    Current Outpatient Prescriptions  Medication Sig Dispense Refill  . Calcium-Magnesium-Vitamin D (CALCIUM 500 PO) Take by mouth daily.    . Multiple Vitamin (MULTI-VITAMIN DAILY PO) Take by mouth daily.    . fluconazole (DIFLUCAN) 150 MG tablet Take one tablet.  Repeat in 72 hours if symptoms are not completely resolved. 2 tablet 0   No current facility-administered medications for this visit.     ALLERGIES: Penicillins  Family History  Problem Relation Age of Onset  . Diabetes Mother     AODM  . Thyroid disease Mother     hypo  . Cancer Father     carcinoma in sinus cavity  . Diabetes Maternal  Grandmother     AODM    Social History   Social History  . Marital Status: Married    Spouse Name: N/A  . Number of Children: N/A  . Years of Education: N/A   Occupational History  . Not on file.   Social History Main Topics  . Smoking status: Never Smoker   . Smokeless tobacco: Never Used  . Alcohol Use: No  . Drug Use: No  . Sexual Activity:    Partners: Male    Birth Control/ Protection: Surgical     Comment: TAH/BSO   Other Topics Concern  . Not on file   Social History Narrative    Review of Systems  Constitutional: Negative.   HENT: Negative.   Eyes: Negative.   Respiratory: Negative.   Cardiovascular: Negative.   Gastrointestinal: Negative.   Genitourinary: Negative for urgency and frequency.       Vaginal discharge, itching and burning   Musculoskeletal: Negative.   Skin: Negative.   Neurological: Negative.   Endo/Heme/Allergies: Negative.     PHYSICAL EXAMINATION:    BP 98/70 mmHg  Pulse 80  Resp 14  Wt 151 lb (68.493 kg)  LMP 10/15/2014 (Approximate)    General appearance: alert, cooperative and appears stated age  Pelvic: External genitalia:  no lesions, +erythema  Urethra:  normal appearing urethra with no masses, tenderness or lesions              Bartholins and Skenes: normal                 Vagina: erythematous vagina with an increase in white/yellow thick vaginal d/c              Cervix: absent  Chaperone was present for exam.  Wet prep: no clue, no trich, ++ wbc, ++yeast KOH: +++ yeast PH: 5   ASSESSMENT Yeast vaginitis    PLAN Diflucan Offered a steroid ointment, she declined   An After Visit Summary was printed and given to the patient.

## 2016-02-09 NOTE — Patient Instructions (Signed)

## 2016-08-05 ENCOUNTER — Ambulatory Visit: Payer: Commercial Managed Care - HMO | Admitting: Obstetrics and Gynecology

## 2016-09-19 ENCOUNTER — Ambulatory Visit (INDEPENDENT_AMBULATORY_CARE_PROVIDER_SITE_OTHER): Payer: Commercial Managed Care - HMO | Admitting: Obstetrics and Gynecology

## 2016-09-19 ENCOUNTER — Encounter: Payer: Self-pay | Admitting: Obstetrics and Gynecology

## 2016-09-19 VITALS — BP 102/64 | HR 64 | Ht 62.0 in | Wt 153.0 lb

## 2016-09-19 DIAGNOSIS — R7989 Other specified abnormal findings of blood chemistry: Secondary | ICD-10-CM

## 2016-09-19 DIAGNOSIS — E559 Vitamin D deficiency, unspecified: Secondary | ICD-10-CM | POA: Diagnosis not present

## 2016-09-19 DIAGNOSIS — Z0000 Encounter for general adult medical examination without abnormal findings: Secondary | ICD-10-CM

## 2016-09-19 DIAGNOSIS — Z23 Encounter for immunization: Secondary | ICD-10-CM | POA: Diagnosis not present

## 2016-09-19 DIAGNOSIS — Z01419 Encounter for gynecological examination (general) (routine) without abnormal findings: Secondary | ICD-10-CM

## 2016-09-19 LAB — POCT URINALYSIS DIPSTICK
BILIRUBIN UA: NEGATIVE
Glucose, UA: NEGATIVE
KETONES UA: NEGATIVE
Leukocytes, UA: NEGATIVE
Nitrite, UA: NEGATIVE
PH UA: 8
Protein, UA: NEGATIVE
RBC UA: NEGATIVE
Urobilinogen, UA: NEGATIVE

## 2016-09-19 NOTE — Patient Instructions (Signed)

## 2016-09-19 NOTE — Progress Notes (Signed)
40 y.o. G74P0010 Married Caucasian female here for annual exam.    PCP:  None  Patient's last menstrual period was 10/15/2014 (approximate).           Sexually active: Yes.    The current method of family planning is status post hysterectomy.    Exercising: Yes.    Starting running.  Working out at gym.  Hot yoga 3 - 4 times per week.  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  Declines today.  Gardasil:   N/A  Hep C:  Not indicated due to age Screening Labs:  Hb today: 12.6, Urine today: Negative   reports that she has never smoked. She has never used smokeless tobacco. She reports that she does not drink alcohol or use drugs.  Past Medical History:  Diagnosis Date  . Abnormal Pap smear 2006   HSIL (ASC-H) + HPV    Past Surgical History:  Procedure Laterality Date  . ABDOMINAL HYSTERECTOMY N/A 10/21/2014   Procedure: HYSTERECTOMY ABDOMINAL with bilateral salpingectomy;  Surgeon: Jamey Reas de Berton Lan, MD;  Location: [facility];  Service: Gynecology;  Laterality: N/A;  . CERVICAL BIOPSY  W/ LOOP ELECTRODE EXCISION  06/03/05   HGSIL CIN II/CINIII  . TONSILLECTOMY AND ADENOIDECTOMY    . WISDOM TOOTH EXTRACTION      Current Outpatient Prescriptions  Medication Sig Dispense Refill  . Calcium-Magnesium-Vitamin D (CALCIUM 500 PO) Take by mouth daily.    . fluconazole (DIFLUCAN) 150 MG tablet Take one tablet.  Repeat in 72 hours if symptoms are not completely resolved. 2 tablet 0  . Multiple Vitamin (MULTI-VITAMIN DAILY PO) Take by mouth daily.     No current facility-administered medications for this visit.     Family History  Problem Relation Age of Onset  . Diabetes Mother     AODM  . Thyroid disease Mother     hypo  . Cancer Father     carcinoma in sinus cavity  . Diabetes Maternal Grandmother      AODM    ROS:  Pertinent items are noted in HPI.  Otherwise, a comprehensive ROS was negative.  Exam:   LMP 10/15/2014 (Approximate)     General appearance: alert, cooperative and appears stated age Head: Normocephalic, without obvious abnormality, atraumatic Neck: no adenopathy, supple, symmetrical, trachea midline and thyroid normal to inspection and palpation Lungs: clear to auscultation bilaterally Breasts: normal appearance, no masses or tenderness, No nipple retraction or dimpling, No nipple discharge or bleeding, No axillary or supraclavicular adenopathy Heart: regular rate and rhythm Abdomen: Pfannenstiel incision, soft, non-tender; no masses, no organomegaly Extremities: extremities normal, atraumatic, no cyanosis or edema Skin: Skin color, texture, turgor normal. No rashes or lesions Lymph nodes: Cervical, supraclavicular, and axillary nodes normal. No abnormal inguinal nodes palpated Neurologic: Grossly normal  Pelvic: External genitalia:  no lesions              Urethra:  normal appearing urethra with no masses, tenderness or lesions              Bartholins and Skenes: normal                 Vagina: normal appearing vagina with normal color and discharge, no lesions  Cervix:  absent              Pap taken: Yes.   Bimanual Exam:  Uterus:  absent              Adnexa: no mass, fullness, tenderness.  Bladder feels full.              Rectal exam: Yes.  .  Confirms.              Anus:  normal sphincter tone, no lesions  Chaperone was present for exam.  Assessment:   Well woman visit with normal exam. Hx CIN III.  Status post total abdominal hysterectomy with bilateral salpingectomy.  Plan: Yearly mammogram recommended after age 30.  [facility] given. Recommended self breast exam.  Pap and HR HPV as above. Guidelines for Calcium, Vitamin D, regular exercise program including cardiovascular and weight bearing  exercise. Routine labs.  TDap.  Follow up annually and prn.       After visit summary provided.

## 2016-09-20 ENCOUNTER — Encounter: Payer: Self-pay | Admitting: Obstetrics and Gynecology

## 2016-09-20 LAB — CBC
HEMATOCRIT: 39.1 % (ref 35.0–45.0)
Hemoglobin: 12.8 g/dL (ref 11.7–15.5)
MCH: 28.8 pg (ref 27.0–33.0)
MCHC: 32.7 g/dL (ref 32.0–36.0)
MCV: 87.9 fL (ref 80.0–100.0)
MPV: 9.3 fL (ref 7.5–12.5)
PLATELETS: 321 10*3/uL (ref 140–400)
RBC: 4.45 MIL/uL (ref 3.80–5.10)
RDW: 14.2 % (ref 11.0–15.0)
WBC: 8.2 10*3/uL (ref 3.8–10.8)

## 2016-09-20 LAB — COMPREHENSIVE METABOLIC PANEL
ALK PHOS: 71 U/L (ref 33–115)
ALT: 12 U/L (ref 6–29)
AST: 14 U/L (ref 10–30)
Albumin: 4.2 g/dL (ref 3.6–5.1)
BUN: 8 mg/dL (ref 7–25)
CALCIUM: 9.1 mg/dL (ref 8.6–10.2)
CHLORIDE: 103 mmol/L (ref 98–110)
CO2: 31 mmol/L (ref 20–31)
Creat: 0.62 mg/dL (ref 0.50–1.10)
GLUCOSE: 86 mg/dL (ref 65–99)
POTASSIUM: 3.9 mmol/L (ref 3.5–5.3)
Sodium: 139 mmol/L (ref 135–146)
TOTAL PROTEIN: 6.6 g/dL (ref 6.1–8.1)
Total Bilirubin: 0.3 mg/dL (ref 0.2–1.2)

## 2016-09-20 LAB — VITAMIN D 25 HYDROXY (VIT D DEFICIENCY, FRACTURES): VIT D 25 HYDROXY: 16 ng/mL — AB (ref 30–100)

## 2016-09-20 LAB — LIPID PANEL
CHOLESTEROL: 155 mg/dL (ref <200)
HDL: 30 mg/dL — AB (ref >50)
LDL CALC: 84 mg/dL
TRIGLYCERIDES: 207 mg/dL — AB (ref <150)
Total CHOL/HDL Ratio: 5.2 Ratio — ABNORMAL HIGH (ref <5.0)
VLDL: 41 mg/dL — AB (ref <30)

## 2016-09-20 LAB — TSH: TSH: 2.49 mIU/L

## 2016-09-20 MED ORDER — VITAMIN D (ERGOCALCIFEROL) 1.25 MG (50000 UNIT) PO CAPS: 50000.0000 [IU] | ORAL_CAPSULE | ORAL | 0 refills | Status: DC

## 2016-09-21 LAB — HEMOGLOBIN, FINGERSTICK: Hemoglobin, fingerstick: 12.6 g/dL (ref 12.0–16.0)

## 2016-09-22 LAB — IPS PAP TEST WITH HPV

## 2017-02-14 ENCOUNTER — Other Ambulatory Visit: Payer: Self-pay | Admitting: Obstetrics and Gynecology

## 2017-02-14 NOTE — Telephone Encounter (Signed)
Medication refill request: Vitamin D  Last AEX:  09-19-16  Next AEX: 09-27-17  Last MMG (if hormonal medication request): none on file  Refill authorized: please advise

## 2017-07-27 ENCOUNTER — Other Ambulatory Visit: Payer: Self-pay | Admitting: Obstetrics and Gynecology

## 2017-07-27 DIAGNOSIS — Z1231 Encounter for screening mammogram for malignant neoplasm of breast: Secondary | ICD-10-CM

## 2017-08-01 ENCOUNTER — Ambulatory Visit
Admission: RE | Admit: 2017-08-01 | Discharge: 2017-08-01 | Disposition: A | Discharge status: Home / Self Care | Payer: 59 | Source: Ambulatory Visit | Attending: Obstetrics and Gynecology | Admitting: Obstetrics and Gynecology

## 2017-08-01 DIAGNOSIS — Z1231 Encounter for screening mammogram for malignant neoplasm of breast: Secondary | ICD-10-CM

## 2017-09-27 ENCOUNTER — Encounter: Payer: Self-pay | Admitting: Obstetrics and Gynecology

## 2017-09-27 ENCOUNTER — Ambulatory Visit: Payer: 59 | Admitting: Obstetrics and Gynecology

## 2017-09-27 ENCOUNTER — Other Ambulatory Visit (HOSPITAL_COMMUNITY)
Admission: RE | Admit: 2017-09-27 | Discharge: 2017-09-27 | Disposition: A | Discharge status: Home / Self Care | Payer: 59 | Source: Ambulatory Visit | Attending: Obstetrics and Gynecology | Admitting: Obstetrics and Gynecology

## 2017-09-27 VITALS — BP 122/64 | HR 66 | Resp 14 | Ht 62.0 in | Wt 154.4 lb

## 2017-09-27 DIAGNOSIS — Z01419 Encounter for gynecological examination (general) (routine) without abnormal findings: Secondary | ICD-10-CM | POA: Diagnosis not present

## 2017-09-27 NOTE — Progress Notes (Signed)
41 y.o. G33P0010 Married Caucasian female here for annual exam.    Doing weight loss through [employer].  Had extensive labs through this program.  Had low HDL, elevated LDL, and low vit D.  Elevated thyroid perixodase with normal TFTs.   Did labs through [facility] and these will be scanned in.   PCP: None  Patient's last menstrual period was 10/15/2014 (approximate).           Sexually active: Yes.   female The current method of family planning is status post hysterectomy.    Exercising: Yes.    Yoga and running Smoker:  no  Health Maintenance: Pap:  09-19-16 Neg:Neg HR HPV, 07-27-15 Neg:Neg HR HPV History of abnormal Pap:  Yes, Hx of LEEP 2006--CIN 3.  Final cervical pathology on hysterectomy is benign.  MMG: 08-01-17 Density C/Neg/BiRads1:TBC Colonoscopy: n/a BMD:   n/a  Result  n/a TDaP:  09-19-16 Gardasil:   no HIV:n/a Hep C:n/a Screening Labs:  Hb today: 09-04-17 thru [employer], Urine today: not done   reports that  has never smoked. she has never used smokeless tobacco. She reports that she does not drink alcohol or use drugs.  Past Medical History:  Diagnosis Date  . 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 Hyperlipidemia 2017  . Low vitamin D level 2017    Past Surgical History:  Procedure Laterality Date  . CERVICAL BIOPSY  W/ LOOP ELECTRODE EXCISION  06/03/05   HGSIL CIN II/CINIII  . TONSILLECTOMY AND ADENOIDECTOMY    . WISDOM TOOTH EXTRACTION      Current Outpatient Medications  Medication Sig Dispense Refill  . Calcium-Magnesium-Vitamin D (CALCIUM 500 PO) Take by mouth daily.    . Multiple Vitamin (MULTI-VITAMIN DAILY PO) Take by mouth daily.     No current facility-administered medications for this visit.     Family History  Problem Relation Age of Onset  . Diabetes Mother        AODM  . Thyroid disease Mother        hypo  . Cancer Father        carcinoma in sinus cavity  . Diabetes Maternal Grandmother        AODM  . Multiple  sclerosis Sister   . Obesity Brother        prediabetes due to weight  . Breast cancer Maternal Aunt        DX'd in 64's  . Diabetes Maternal Aunt   . Diabetes Maternal Uncle   . Obesity Brother        heart issues due to diet/weight  . Diabetes Maternal Aunt   . Diabetes Maternal Aunt   . Diabetes Maternal Aunt     ROS:  Pertinent items are noted in HPI.  Otherwise, a comprehensive ROS was negative.  Exam:   BP 122/64 (BP Location: Right Arm, Patient Position: Sitting, Cuff Size: Normal)   Pulse 66   Resp 14   Ht 5\' 2"  (1.575 m)   Wt 154 lb 6.4 oz (70 kg)   LMP 10/15/2014 (Approximate)   BMI 28.24 kg/m     General appearance: alert, cooperative and appears stated age Head: Normocephalic, without obvious abnormality, atraumatic Neck: no adenopathy, supple, symmetrical, trachea midline and thyroid normal to inspection and palpation Lungs: clear to auscultation bilaterally Breasts: normal appearance, no masses or tenderness, No nipple retraction or dimpling, No nipple discharge or bleeding, No axillary or supraclavicular adenopathy Heart: regular rate and rhythm  Abdomen: soft, non-tender; no masses, no organomegaly Extremities: extremities normal, atraumatic, no cyanosis or edema Skin: Skin color, texture, turgor normal. No rashes or lesions Lymph nodes: Cervical, supraclavicular, and axillary nodes normal. No abnormal inguinal nodes palpated Neurologic: Grossly normal  Pelvic: External genitalia:  no lesions              Urethra:  normal appearing urethra with no masses, tenderness or lesions              Bartholins and Skenes: normal                 Vagina: normal appearing vagina with normal color and discharge, no lesions              Cervix: absent.                Pap taken: Yes.   Bimanual Exam:  Uterus:   Absent.               Adnexa: no mass, fullness, tenderness              Rectal exam: Yes.  .  Confirms.              Anus:  normal sphincter tone, no  lesions  Chaperone was present for exam.  Assessment:   Well woman visit with normal exam. Hx CIN III.  Status post total abdominal hysterectomy with bilateral salpingectomy. Hx low vit D. Positive thyroid peroxidase.  Normal TFTs. FH thyroid disease. Unfavorable cholesterol ratios.  Plan: Mammogram screening discussed. Recommended self breast awareness. Pap and HR HPV as above. Guidelines for Calcium, Vitamin D, regular exercise program including cardiovascular and weight bearing exercise. I discussed Gardasil vaccine and she declines.  She will follow a diet low in cholesterol and work on weight loss of 20 pounds during the next year.  I recommend yearly TFTs. I did encourage a PCP for her.  Follow up annually and prn.   After visit summary provided.

## 2017-09-27 NOTE — Patient Instructions (Signed)

## 2017-10-09 LAB — CYTOLOGY - PAP
Diagnosis: NEGATIVE
HPV (WINDOPATH): NOT DETECTED

## 2018-10-04 ENCOUNTER — Ambulatory Visit: Payer: 59 | Admitting: Obstetrics and Gynecology

## 2018-11-15 ENCOUNTER — Other Ambulatory Visit: Payer: Self-pay

## 2018-11-15 ENCOUNTER — Other Ambulatory Visit (HOSPITAL_COMMUNITY)
Admission: RE | Admit: 2018-11-15 | Discharge: 2018-11-15 | Disposition: A | Discharge status: Home / Self Care | Payer: 59 | Source: Ambulatory Visit | Attending: Obstetrics and Gynecology | Admitting: Obstetrics and Gynecology

## 2018-11-15 ENCOUNTER — Encounter: Payer: Self-pay | Admitting: Obstetrics and Gynecology

## 2018-11-15 ENCOUNTER — Ambulatory Visit (INDEPENDENT_AMBULATORY_CARE_PROVIDER_SITE_OTHER): Payer: 59 | Admitting: Obstetrics and Gynecology

## 2018-11-15 VITALS — BP 108/66 | HR 80 | Resp 16 | Ht 62.0 in | Wt 159.4 lb

## 2018-11-15 DIAGNOSIS — Z01419 Encounter for gynecological examination (general) (routine) without abnormal findings: Secondary | ICD-10-CM

## 2018-11-15 NOTE — Patient Instructions (Signed)
EXERCISE AND DIET:  We recommended that you start or continue a regular exercise program for good health. Regular exercise means any activity that makes your heart beat faster and makes you sweat.  We recommend exercising at least 30 minutes per day at least 3 days a week, preferably 4 or 5.  We also recommend a diet low in fat and sugar.  Inactivity, poor dietary choices and obesity can cause diabetes, heart attack, stroke, and kidney damage, among others.    ALCOHOL AND SMOKING:  Women should limit their alcohol intake to no more than 7 drinks/beers/glasses of wine (combined, not each!) per week. Moderation of alcohol intake to this level decreases your risk of breast cancer and liver damage. And of course, no recreational drugs are part of a healthy lifestyle.  And absolutely no smoking or even second hand smoke. Most people know smoking can cause heart and lung diseases, but did you know it also contributes to weakening of your bones? Aging of your skin?  Yellowing of your teeth and nails?  CALCIUM AND VITAMIN D:  Adequate intake of calcium and Vitamin D are recommended.  The recommendations for exact amounts of these supplements seem to change often, but generally speaking 600 mg of calcium (either carbonate or citrate) and 800 units of Vitamin D per day seems prudent. Certain women may benefit from higher intake of Vitamin D.  If you are among these women, your doctor will have told you during your visit.    PAP SMEARS:  Pap smears, to check for cervical cancer or precancers,  have traditionally been done yearly, although recent scientific advances have shown that most women can have pap smears less often.  However, every woman still should have a physical exam from her gynecologist every year. It will include a breast check, inspection of the vulva and vagina to check for abnormal growths or skin changes, a visual exam of the cervix, and then an exam to evaluate the size and shape of the uterus and  ovaries.  And after 45 years of age, a rectal exam is indicated to check for rectal cancers. We will also provide age appropriate advice regarding health maintenance, like when you should have certain vaccines, screening for sexually transmitted diseases, bone density testing, colonoscopy, mammograms, etc.   MAMMOGRAMS:  All women over 40 years old should have a yearly mammogram. Many facilities now offer a "3D" mammogram, which may cost around $50 extra out of pocket. If possible,  we recommend you accept the option to have the 3D mammogram performed.  It both reduces the number of women who will be called back for extra views which then turn out to be normal, and it is better than the routine mammogram at detecting truly abnormal areas.    COLONOSCOPY:  Colonoscopy to screen for colon cancer is recommended for all women at age 50.  We know, you hate the idea of the prep.  We agree, BUT, having colon cancer and not knowing it is worse!!  Colon cancer so often starts as a polyp that can be seen and removed at colonscopy, which can quite literally save your life!  And if your first colonoscopy is normal and you have no family history of colon cancer, most women don't have to have it again for 10 years.  Once every ten years, you can do something that may end up saving your life, right?  We will be happy to help you get it scheduled when you are ready.    Be sure to check your insurance coverage so you understand how much it will cost.  It may be covered as a preventative service at no cost, but you should check your particular policy.      HPV Vaccine Information for Parents  HPV (human papillomavirus) is a common virus that spreads from person to person through sexual contact. It can spread during vaginal, anal, or oral sex. There are many types of HPV viruses, and some may cause cancer. Your child can get a vaccination to prevent HPV infection and cancer. The vaccine is both safe and effective. It is  recommended for boys and girls at about 66-44 years of age. Getting the vaccination at this age-before becoming sexually active-gives your child the best chance at protection from HPV infection through adulthood. How can HPV affect my child? HPV infection can cause:  Genital warts.  Mouth or throat cancer (oropharyngeal cancer).  Anal cancer.  Cervical, vulvar, or vaginal cancer.  Penile cancer. During pregnancy, HPV infection can be passed to the baby. This infection can cause warts to develop in a baby's throat and windpipe. What actions can I take to lower my child's risk for HPV? To lower your child's risk for HPV infection, have him or her get the HPV vaccination before becoming sexually active. The best time for vaccination is between ages 77 and 60, though it can be given to children as young as 92 years old. If your child gets the first dose before age 50, the vaccination can be given as 2 shots (doses), 6-12 months apart. In some situations, 3 doses are needed:  If your child starts the vaccine before age 62 but does not have a second dose within 6-12 months, your child will need 3 doses to complete the vaccination. When your child has the first dose, it is important to make an appointment for the next shot and keep the appointment.  Teens who are not vaccinated before age 79 will need 3 doses given within 6 months.  If your child has a weak immune system, he or she may need 3 doses. Young adults can also get the vaccination, even if they are already sexually active and even if they have already been infected with HPV. The vaccination can still help prevent the types of cancer-causing HPV that a person has not been infected with. What are the risks and benefits of the HPV vaccine? Benefits The main benefit of getting vaccinated is to prevent certain cancers, including:  Cervical, vulvar, and vaginal cancer in females.  Penile cancer in males.  Oral and anal cancer in both males  and females. The risk of these cancers is lower if your child gets vaccinated before he or she becomes sexually active. The vaccine also prevents genital warts caused by HPV. Risks The risks, although low, include side effects or reactions to the vaccine. Very few reactions have been reported, but they can include:  Soreness, redness, or swelling at the injection site.  Dizziness or headache.  Fever. Who should not get the HPV vaccine or should wait to get it? Some children should not get the HPV vaccine or should wait. Discuss the risks and benefits of the vaccine with your child's health care provider if your child:  Has had a severe allergic reaction to other vaccinations.  Is allergic to yeast.  Has a fever.  Has had a recent illness.  Is pregnant or may be pregnant. Where to find more information  Centers for Disease Control  and Prevention: https://www.boyd-meyer.org/  American Academy of Pediatrics: healthychildren.org Summary  HPV (human papillomavirus) is a common virus that spreads from person to person through sexual contact. It can spread during vaginal, anal, or oral sex.  Your child can get a vaccination to prevent HPV infection and cancer. It is best to get the vaccination before becoming sexually active.  The HPV vaccine can protect your child from genital warts and certain types of cancer, including cancer of the cervix, throat, mouth, vulva, vagina, anus, and penis.  The HPV vaccine is both safe and effective.  The best time for boys and girls to get the vaccination is when they are between ages 13 and 87. This information is not intended to replace advice given to you by your health care provider. Make sure you discuss any questions you have with your health care provider. Document Released: 01/18/2018 Document Revised: 03/19/2018 Document Reviewed: 01/18/2018 Elsevier Interactive Patient Education  2019 [employer].

## 2018-11-15 NOTE — Progress Notes (Signed)
43 y.o. G19P0010 Married Caucasian female here for annual exam.    Taking vit B12.  Supplementing with calcium and vit D3.   [facility] group near her work.   PCP:  None   Patient's last menstrual period was 10/15/2014 (approximate).           Sexually active: Yes.   female The current method of family planning is status post hysterectomy.    Exercising: Yes.    cardio and weights  Going to [employer]. 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  Final cervical pathology on hysterectomy is benign.  MMG: 08-01-17 Neg/density C/BiRads1 --pt knows she needs to schedule Colonoscopy:  n/a BMD:   n/a  Result  n/a TDaP:  09-19-16 Gardasil:   no HIV:no Hep C: no Screening Labs: ---   reports that she has never smoked. She has never used smokeless tobacco. She reports that she does not drink alcohol or use drugs.  Past Medical History:  Diagnosis Date  . 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 Hyperlipidemia 2017  . Low vitamin D level 2017    Past Surgical History:  Procedure Laterality Date  . ABDOMINAL HYSTERECTOMY N/A 10/21/2014   Procedure: HYSTERECTOMY ABDOMINAL with bilateral salpingectomy;  Surgeon: Jamey Reas de Berton Lan, MD;  Location: [facility];  Service: Gynecology;  Laterality: N/A;  . CERVICAL BIOPSY  W/ LOOP ELECTRODE EXCISION  06/03/05   HGSIL CIN II/CINIII  . TONSILLECTOMY AND ADENOIDECTOMY    . WISDOM TOOTH EXTRACTION      Current Outpatient Medications  Medication Sig Dispense Refill  . Calcium-Magnesium-Vitamin D (CALCIUM 500 PO) Take by mouth daily.    . Multiple Vitamin (MULTI-VITAMIN DAILY PO) Take by mouth daily.     No current facility-administered medications for this visit.     Family History  Problem Relation Age of Onset  . Diabetes Mother        AODM  . Thyroid disease Mother        hypo  . Cancer Father        carcinoma in  sinus cavity  . Diabetes Maternal Grandmother        AODM  . Multiple sclerosis Sister   . Obesity Brother        prediabetes due to weight  . Breast cancer Maternal Aunt        DX'd in 94's  . Diabetes Maternal Aunt   . Diabetes Maternal Uncle   . Obesity Brother        heart issues due to diet/weight  . Diabetes Maternal Aunt   . Diabetes Maternal Aunt   . Diabetes Maternal Aunt     Review of Systems  All other systems reviewed and are negative.   Exam:   BP 108/66 (BP Location: Right Arm, Patient Position: Sitting, Cuff Size: Normal)   Pulse 80   Resp 16   Ht 5\' 2"  (1.575 m)   Wt 159 lb 6.4 oz (72.3 kg)   LMP 10/15/2014 (Approximate)   BMI 29.15 kg/m     General appearance: alert, cooperative and appears stated age Head: Normocephalic, without obvious abnormality, atraumatic Neck: no adenopathy, supple, symmetrical, trachea midline and thyroid normal to inspection and palpation Lungs: clear to auscultation bilaterally Breasts: normal appearance, no masses or tenderness, No nipple retraction or dimpling, No nipple discharge or  bleeding, No axillary or supraclavicular adenopathy Heart: regular rate and rhythm Abdomen: soft, non-tender; no masses, no organomegaly Extremities: extremities normal, atraumatic, no cyanosis or edema Skin: Skin color, texture, turgor normal. No rashes or lesions Lymph nodes: Cervical, supraclavicular, and axillary nodes normal. No abnormal inguinal nodes palpated Neurologic: Grossly normal  Pelvic: External genitalia:  no lesions              Urethra:  normal appearing urethra with no masses, tenderness or lesions              Bartholins and Skenes: normal                 Vagina: normal appearing vagina with normal color and discharge, no lesions              Cervix:  absent              Pap taken: No. Bimanual Exam:  Uterus:   absent              Adnexa: no mass, fullness, tenderness              Rectal exam: Yes.  .  Confirms.               Anus:  normal sphincter tone, no lesions  Chaperone was present for exam.  Assessment:   Well woman visit with normal exam. Status post total abdominal hysterectomy with bilateral salpingectomy. Hx low vit D. Positive thyroid peroxidase.  Normal TFTs. FH thyroid disease. Unfavorable cholesterol ratios.  Plan: Mammogram screening. Recommended self breast awareness. Pap and HR HPV as above. Guidelines for Calcium, Vitamin D, regular exercise program including cardiovascular and weight bearing exercise. Flu vaccine discussed.  She will establish care with PCP and do her fasting labs there.  Follow up annually and prn.   After visit summary provided.

## 2018-11-20 LAB — CYTOLOGY - PAP
Diagnosis: NEGATIVE
HPV (WINDOPATH): NOT DETECTED

## 2019-11-20 ENCOUNTER — Ambulatory Visit: Payer: 59 | Admitting: Obstetrics and Gynecology

## 2019-12-18 ENCOUNTER — Ambulatory Visit: Payer: 59 | Admitting: Obstetrics and Gynecology

## 2020-05-15 ENCOUNTER — Other Ambulatory Visit: Payer: Self-pay | Admitting: Obstetrics and Gynecology

## 2020-05-15 DIAGNOSIS — Z1231 Encounter for screening mammogram for malignant neoplasm of breast: Secondary | ICD-10-CM

## 2020-05-22 ENCOUNTER — Other Ambulatory Visit: Payer: Self-pay

## 2020-05-22 ENCOUNTER — Ambulatory Visit
Admission: RE | Admit: 2020-05-22 | Discharge: 2020-05-22 | Disposition: A | Discharge status: Home / Self Care | Payer: 59 | Source: Ambulatory Visit | Attending: Obstetrics and Gynecology | Admitting: Obstetrics and Gynecology

## 2020-05-22 DIAGNOSIS — Z1231 Encounter for screening mammogram for malignant neoplasm of breast: Secondary | ICD-10-CM

## 2020-05-27 NOTE — Progress Notes (Signed)
44 y.o. G83P0010 Married Caucasian female here for annual exam.    No real hot flashes.   She has not done a Covid vaccine.   PCP:  None  Patient's last menstrual period was 10/15/2014 (approximate).           Sexually active: Yes.    The current method of family planning is status post hysterectomy.    Exercising: Yes.    Gym/ health club routine includes cardio.and weights Smoker:  no  Health Maintenance: Pap: 11-15-18 Neg:Neg HR HPV, 09-27-17 Neg:Neg HR HPV, 09-19-16 Neg:Neg HR HPV  History of abnormal Pap: Yes, Hx of LEEP - CIN 3 2006 MMG: 05-22-20 3D/Lt.Br.asymmetry warrants further evaluation, Rt.Br.Neg/density C/--will schedule Diag.w/poss.Lt.Br.US--appt. 06-08-20 Colonoscopy:  n/a BMD:   n/a  Result  n/a TDaP:  09-19-16 Gardasil:   no HIV:no Hep C:no Screening Labs:  Today.    reports that she has never smoked. She has never used smokeless tobacco. She reports that she does not drink alcohol and does not use drugs.  Past Medical History:  Diagnosis Date  . 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 Hyperlipidemia 2017  . Low vitamin D level 2017    Past Surgical History:  Procedure Laterality Date  . ABDOMINAL HYSTERECTOMY N/A 10/21/2014   Procedure: HYSTERECTOMY ABDOMINAL with bilateral salpingectomy;  Surgeon: Jamey Reas de Berton Lan, MD;  Location: [facility];  Service: Gynecology;  Laterality: N/A;  . CERVICAL BIOPSY  W/ LOOP ELECTRODE EXCISION  06/03/05   HGSIL CIN II/CINIII  . TONSILLECTOMY AND ADENOIDECTOMY    . WISDOM TOOTH EXTRACTION      Current Outpatient Medications  Medication Sig Dispense Refill  . Calcium-Magnesium-Vitamin D (CALCIUM 500 PO) Take by mouth daily.    . Multiple Vitamin (MULTI-VITAMIN DAILY PO) Take by mouth daily.     No current facility-administered medications for this visit.    Family History  Problem Relation Age of Onset  . Diabetes Mother        AODM  . Thyroid disease Mother        hypo  . Cancer Father         carcinoma in sinus cavity  . Diabetes Maternal Grandmother        AODM  . Multiple sclerosis Sister   . Obesity Brother        prediabetes due to weight  . Breast cancer Maternal Aunt        DX'd in 64's  . Diabetes Maternal Aunt   . Diabetes Maternal Uncle   . Obesity Brother        heart issues due to diet/weight  . Diabetes Maternal Aunt   . Diabetes Maternal Aunt   . Diabetes Maternal Aunt     Review of Systems  All other systems reviewed and are negative.   Exam:   BP 112/72   Pulse 70   Resp 16   Ht 5' 2.5" (1.588 m)   Wt 161 lb 3.2 oz (73.1 kg)   LMP 10/15/2014 (Approximate)   BMI 29.01 kg/m     General appearance: alert, cooperative and appears stated age Head: normocephalic, without obvious abnormality, atraumatic Neck: no adenopathy, supple, symmetrical, trachea midline and thyroid normal to inspection and palpation Lungs: clear to auscultation bilaterally Breasts: normal appearance, no masses or tenderness, No nipple retraction or dimpling, No nipple discharge or bleeding, No axillary adenopathy Heart: regular rate and rhythm Abdomen: soft, non-tender; no masses, no organomegaly Extremities: extremities  normal, atraumatic, no cyanosis or edema Skin: skin color, texture, turgor normal. No rashes or lesions Lymph nodes: cervical, supraclavicular, and axillary nodes normal. Neurologic: grossly normal  Pelvic: External genitalia:  no lesions              No abnormal inguinal nodes palpated.              Urethra:  normal appearing urethra with no masses, tenderness or lesions              Bartholins and Skenes: normal                 Vagina: normal appearing vagina with normal color and discharge, no lesions              Cervix: absent              Pap taken: No. Bimanual Exam:  Uterus:  absent              Adnexa: no mass, fullness, tenderness              Rectal exam: Yes.  .  Confirms.              Anus:  normal sphincter tone, no lesions  Chaperone  was present for exam.  Assessment:   Well woman visit with normal exam. Status post total abdominal hysterectomy with bilateral salpingectomy. Hx prior LEEP for CIN III.  Hx low vit D. Positive thyroid peroxidase. Normal TFTs. FH thyroid disease. Unfavorable cholesterol ratios.  Plan: Mammogram screening discussed. Self breast awareness reviewed. Pap and HR HPV in 2023.  Guidelines for Calcium, Vitamin D, regular exercise program including cardiovascular and weight bearing exercise. Routine labs today.  She has a list of PCPs and will establish care.  Follow up annually and prn.   After visit summary provided.

## 2020-05-28 ENCOUNTER — Other Ambulatory Visit: Payer: Self-pay | Admitting: Obstetrics and Gynecology

## 2020-05-28 DIAGNOSIS — R928 Other abnormal and inconclusive findings on diagnostic imaging of breast: Secondary | ICD-10-CM

## 2020-06-01 ENCOUNTER — Other Ambulatory Visit: Payer: Self-pay

## 2020-06-01 ENCOUNTER — Encounter: Payer: Self-pay | Admitting: Obstetrics and Gynecology

## 2020-06-01 ENCOUNTER — Ambulatory Visit: Payer: 59 | Admitting: Obstetrics and Gynecology

## 2020-06-01 VITALS — BP 112/72 | HR 70 | Resp 16 | Ht 62.5 in | Wt 161.2 lb

## 2020-06-01 DIAGNOSIS — Z01419 Encounter for gynecological examination (general) (routine) without abnormal findings: Secondary | ICD-10-CM

## 2020-06-01 DIAGNOSIS — Z0000 Encounter for general adult medical examination without abnormal findings: Secondary | ICD-10-CM | POA: Diagnosis not present

## 2020-06-01 NOTE — Patient Instructions (Signed)

## 2020-06-02 LAB — VITAMIN D 25 HYDROXY (VIT D DEFICIENCY, FRACTURES): Vit D, 25-Hydroxy: 39.8 ng/mL (ref 30.0–100.0)

## 2020-06-02 LAB — CBC
Hematocrit: 38.3 % (ref 34.0–46.6)
Hemoglobin: 13.2 g/dL (ref 11.1–15.9)
MCH: 30.3 pg (ref 26.6–33.0)
MCHC: 34.5 g/dL (ref 31.5–35.7)
MCV: 88 fL (ref 79–97)
Platelets: 339 10*3/uL (ref 150–450)
RBC: 4.36 x10E6/uL (ref 3.77–5.28)
RDW: 14.1 % (ref 11.7–15.4)
WBC: 8.7 10*3/uL (ref 3.4–10.8)

## 2020-06-02 LAB — TSH: TSH: 2.9 u[IU]/mL (ref 0.450–4.500)

## 2020-06-02 LAB — COMPREHENSIVE METABOLIC PANEL
ALT: 21 IU/L (ref 0–32)
AST: 23 IU/L (ref 0–40)
Albumin/Globulin Ratio: 1.9 (ref 1.2–2.2)
Albumin: 4.5 g/dL (ref 3.8–4.8)
Alkaline Phosphatase: 73 IU/L (ref 48–121)
BUN/Creatinine Ratio: 18 (ref 9–23)
BUN: 11 mg/dL (ref 6–24)
Bilirubin Total: 0.3 mg/dL (ref 0.0–1.2)
CO2: 22 mmol/L (ref 20–29)
Calcium: 8.9 mg/dL (ref 8.7–10.2)
Chloride: 101 mmol/L (ref 96–106)
Creatinine, Ser: 0.62 mg/dL (ref 0.57–1.00)
GFR calc Af Amer: 125 mL/min/{1.73_m2} (ref >59)
GFR calc non Af Amer: 108 mL/min/{1.73_m2} (ref >59)
Globulin, Total: 2.4 g/dL (ref 1.5–4.5)
Glucose: 103 mg/dL — ABNORMAL HIGH (ref 65–99)
Potassium: 3.9 mmol/L (ref 3.5–5.2)
Sodium: 137 mmol/L (ref 134–144)
Total Protein: 6.9 g/dL (ref 6.0–8.5)

## 2020-06-02 LAB — LIPID PANEL
Chol/HDL Ratio: 4.8 ratio — ABNORMAL HIGH (ref 0.0–4.4)
Cholesterol, Total: 155 mg/dL (ref 100–199)
HDL: 32 mg/dL — ABNORMAL LOW (ref >39)
LDL Chol Calc (NIH): 105 mg/dL — ABNORMAL HIGH (ref 0–99)
Triglycerides: 95 mg/dL (ref 0–149)
VLDL Cholesterol Cal: 18 mg/dL (ref 5–40)

## 2020-06-08 ENCOUNTER — Ambulatory Visit
Admission: RE | Admit: 2020-06-08 | Discharge: 2020-06-08 | Disposition: A | Discharge status: Home / Self Care | Payer: 59 | Source: Ambulatory Visit | Attending: Obstetrics and Gynecology | Admitting: Obstetrics and Gynecology

## 2020-06-08 ENCOUNTER — Other Ambulatory Visit: Payer: Self-pay

## 2020-06-08 DIAGNOSIS — R928 Other abnormal and inconclusive findings on diagnostic imaging of breast: Secondary | ICD-10-CM

## 2020-08-17 ENCOUNTER — Ambulatory Visit: Payer: 59 | Admitting: Obstetrics and Gynecology

## 2021-06-02 ENCOUNTER — Ambulatory Visit: Payer: 59 | Admitting: Obstetrics and Gynecology

## 2021-08-18 NOTE — Progress Notes (Signed)
45 y.o. G90P0010 Married Caucasian female here for annual exam.    Difficult to loose weight.  Some hot flashes during the day.  Some night sweats.  Able to sleep ok.  Not bothersome.  No issues with vaginal dryness.   PCP:  None  Patient's last menstrual period was 10/15/2014 (approximate).           Sexually active: Yes.    The current method of family planning is status post hysterectomy.    Exercising: Yes.     Works out at gym, [employer], cardio Smoker:  no  Health Maintenance: Pap:  11-15-18 Neg:Neg HR HPV, 09-27-17 Neg:Neg HR HPV, 09-19-16 Neg:Neg HR HPV.  Benign cervix at the time of hysterectomy. History of abnormal Pap:  yes, Hx of LEEP MMG: 05-22-20 3D/Lt.Br.poss.asymmetry;Rt.Br.neg. Lt.Br.Diag.w/US was Neg/Birads1/screening 27yr. Pt. Is going to schedule. Colonoscopy:  n/a BMD:   n/a  Result  n/a TDaP:  09-19-16 Gardasil:   no HIV: no Hep C: no Screening Labs:  Will do with PCP. Flu vaccine:  she will do at the pharmacy.     reports that she has never smoked. She has never used smokeless tobacco. She reports that she does not drink alcohol and does not use drugs.  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     Multiple Vitamin (MULTI-VITAMIN DAILY PO) Take by mouth daily.     No current facility-administered medications for this visit.     Family History  Problem Relation Age of Onset   Diabetes Mother        AODM   Thyroid disease Mother        hypo   Cancer Father        carcinoma in sinus cavity   Prostate cancer Father    Multiple sclerosis Sister    Obesity Brother        prediabetes due to weight   Obesity Brother        heart issues due to diet/weight   Breast cancer Maternal Aunt        DX'd in 50's   Diabetes Maternal Aunt    Diabetes Maternal Aunt    Diabetes Maternal Aunt    Diabetes Maternal Aunt    Diabetes Maternal Uncle    Diabetes Maternal Grandmother        AODM    Review of Systems  All other systems reviewed and are negative.  Exam:   BP 110/68   Pulse 96   Ht 5\' 2"  (1.575 m)   Wt 157 lb (71.2 kg)   LMP 10/15/2014 (Approximate)   SpO2 98%   BMI 28.72 kg/m     General appearance: alert, cooperative and appears stated age Head: normocephalic, without obvious abnormality, atraumatic Neck: no adenopathy, supple, symmetrical, trachea midline and thyroid normal to inspection and  palpation Lungs: clear to auscultation bilaterally Breasts: normal appearance, no masses or tenderness, No nipple retraction or dimpling, No nipple discharge or bleeding, No axillary adenopathy Heart: regular rate and rhythm Abdomen: soft, non-tender; no masses, no organomegaly Extremities: extremities normal, atraumatic, no cyanosis or edema Skin: skin color, texture, turgor normal. No rashes or lesions Lymph nodes: cervical, supraclavicular, and axillary nodes normal. Neurologic: grossly normal  Pelvic: External genitalia:  no lesions              No abnormal inguinal nodes palpated.              Urethra:  normal appearing urethra with no masses, tenderness or lesions              Bartholins and Skenes: normal                 Vagina: normal appearing vagina with normal color and discharge, no lesions              Cervix:  absent              Pap taken: no Bimanual Exam:  Uterus:  absent               Adnexa: no mass, fullness, tenderness              Rectal exam: yes.  Confirms.              Anus:  normal sphincter tone, no lesions  Chaperone was present for exam:  Estill Bamberg, CMA  Assessment:   Well woman visit with gynecologic exam. Status post total abdominal hysterectomy with bilateral salpingectomy. Hx prior LEEP for CIN III.  2006.  Cervix with normal histology at time of hysterectomy. Hx low vit D.  Normal value with last check. Positive thyroid peroxidase.  Normal TFTs. FH thyroid disease. Unfavorable cholesterol ratios.  Plan: Mammogram screening discussed. Self breast awareness reviewed. Pap and HR HPV 2023. Guidelines for Calcium, Vitamin D, regular exercise program including cardiovascular and weight bearing exercise. List of PCPs to patient.  She will establish care.  Follow up annually and prn.   After visit summary provided.

## 2021-08-19 ENCOUNTER — Other Ambulatory Visit: Payer: Self-pay

## 2021-08-19 ENCOUNTER — Encounter: Payer: Self-pay | Admitting: Obstetrics and Gynecology

## 2021-08-19 ENCOUNTER — Ambulatory Visit (INDEPENDENT_AMBULATORY_CARE_PROVIDER_SITE_OTHER): Payer: 59 | Admitting: Obstetrics and Gynecology

## 2021-08-19 VITALS — BP 110/68 | HR 96 | Ht 62.0 in | Wt 157.0 lb

## 2021-08-19 DIAGNOSIS — Z01419 Encounter for gynecological examination (general) (routine) without abnormal findings: Secondary | ICD-10-CM | POA: Diagnosis not present

## 2021-08-19 NOTE — Patient Instructions (Signed)

## 2022-06-10 IMAGING — US US BREAST*L* LIMITED INC AXILLA
1 series · 2 of 2 positions shown · non-contrast
Comparison: Previous exam(s).

CLINICAL DATA: Screening recall for possible asymmetry in the left
breast.

EXAM:
DIGITAL DIAGNOSTIC LEFT MAMMOGRAM WITH CAD AND TOMO
ULTRASOUND LEFT BREAST

[Series 1: us breast*left* limited inc axilla · 0.09mm/px · 2 of 2 slices shown]
[im 1/2]
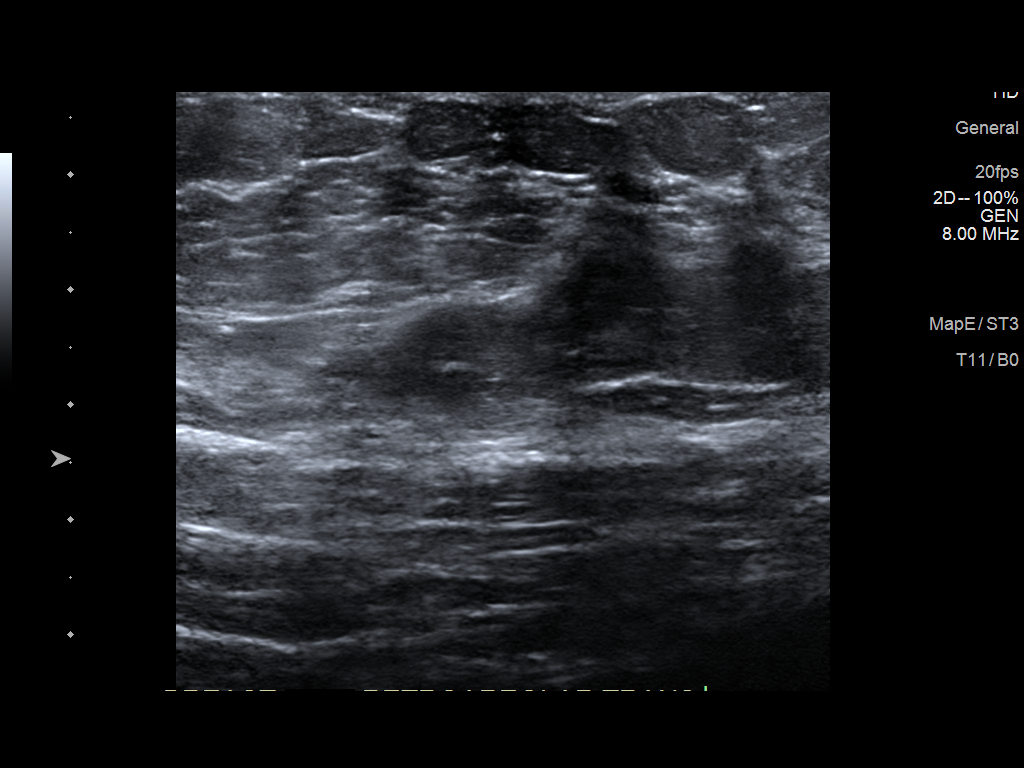
[im 2/2]
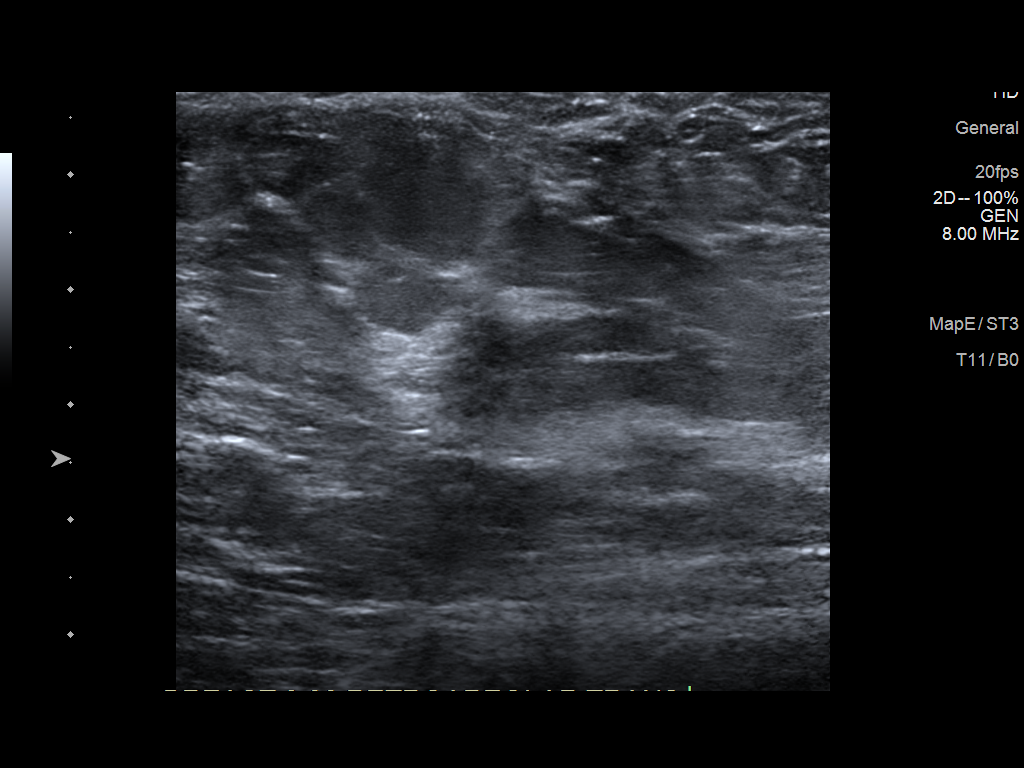

[2 of 2 positions shown; findings below may reference images not displayed]

ACR Breast Density Category c: The breast tissue is heterogeneously
dense, which may obscure small masses.
FINDINGS: The possible asymmetry, noted on the left breast screening cc view,
disperses on spot compression imaging consistent with superimposed
fibroglandular tissue. There is no underlying mass or significant
residual asymmetry. There is no architectural distortion and there
are no suspicious calcifications.

Mammographic images were processed with CAD.

Targeted ultrasound is performed, showing heterogeneous
fibroglandular tissue in the posterior left breast, with imaging
targeted to the central posterior left breast. No mass or suspicious
lesion.
IMPRESSION: Negative exam.  No evidence of breast malignancy.

RECOMMENDATION:
Screening mammogram in one year.(Code:[id number]-[id number])

I have discussed the findings and recommendations with the patient.
If applicable, a reminder letter will be sent to the patient
regarding the next appointment.

BI-RADS CATEGORY  1: Negative.

## 2022-06-10 IMAGING — MG MM DIGITAL DIAGNOSTIC UNILAT*L* W/ TOMO W/ CAD
4 series · 4 of 12 positions shown · non-contrast
Comparison: Previous exam(s).

CLINICAL DATA: Screening recall for possible asymmetry in the left
breast.

EXAM:
DIGITAL DIAGNOSTIC LEFT MAMMOGRAM WITH CAD AND TOMO
ULTRASOUND LEFT BREAST

[L ML synth-2D]
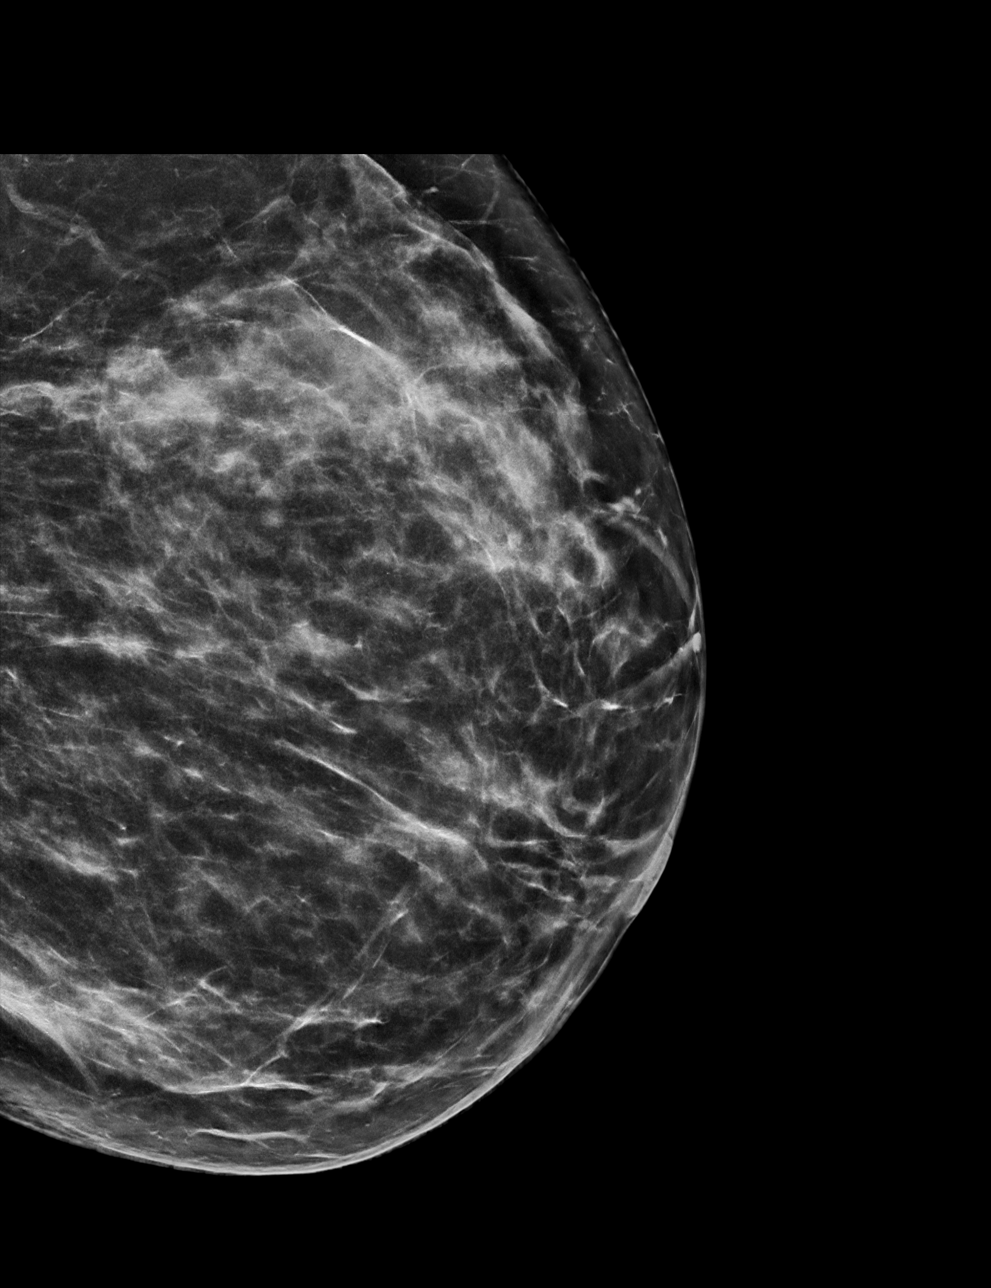

[L CC synth-2D]
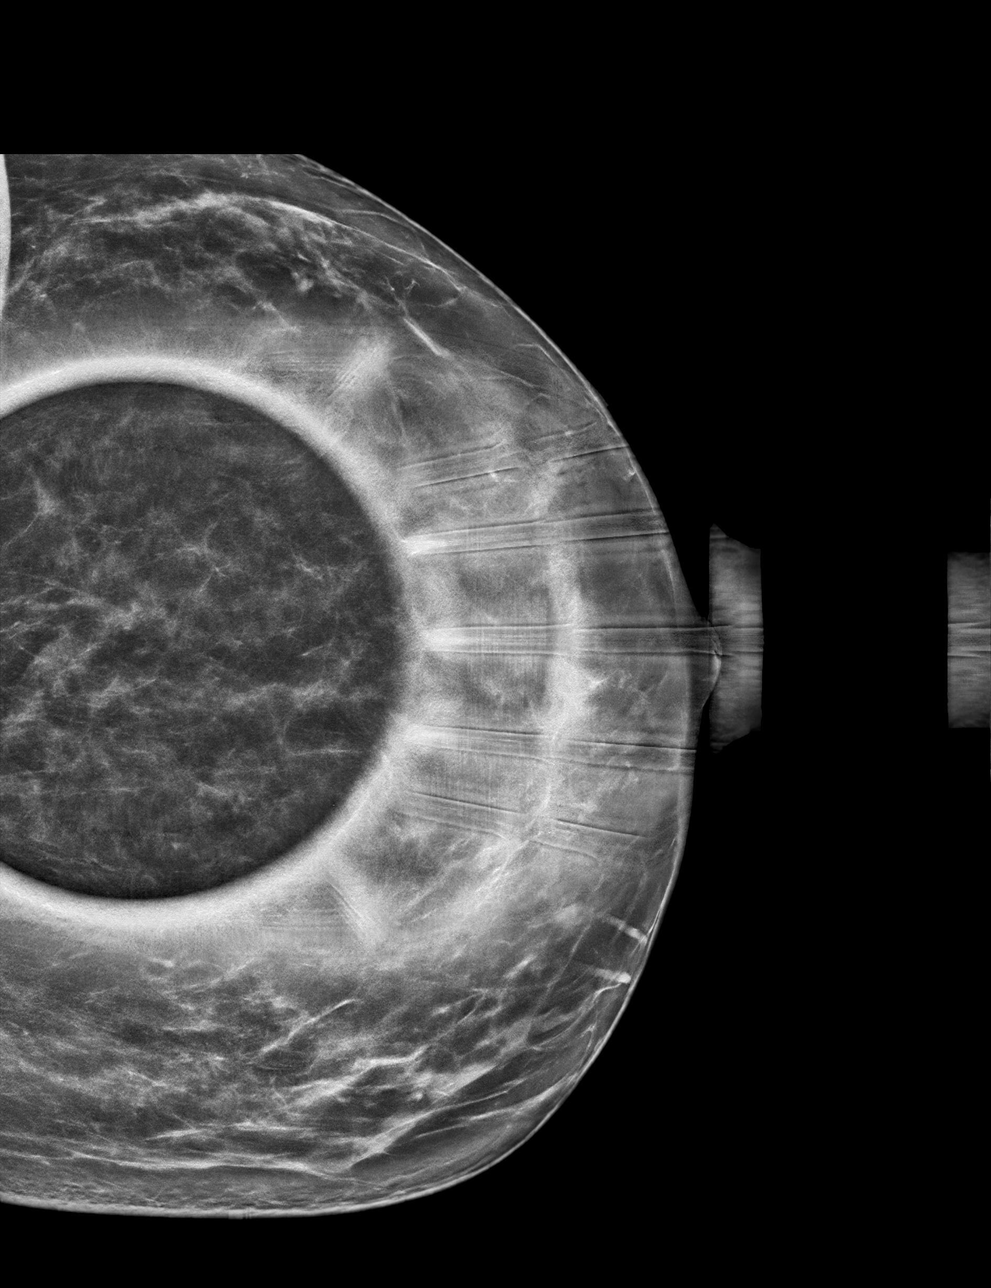

[L CC tomo · tomo slice 38/75.0]
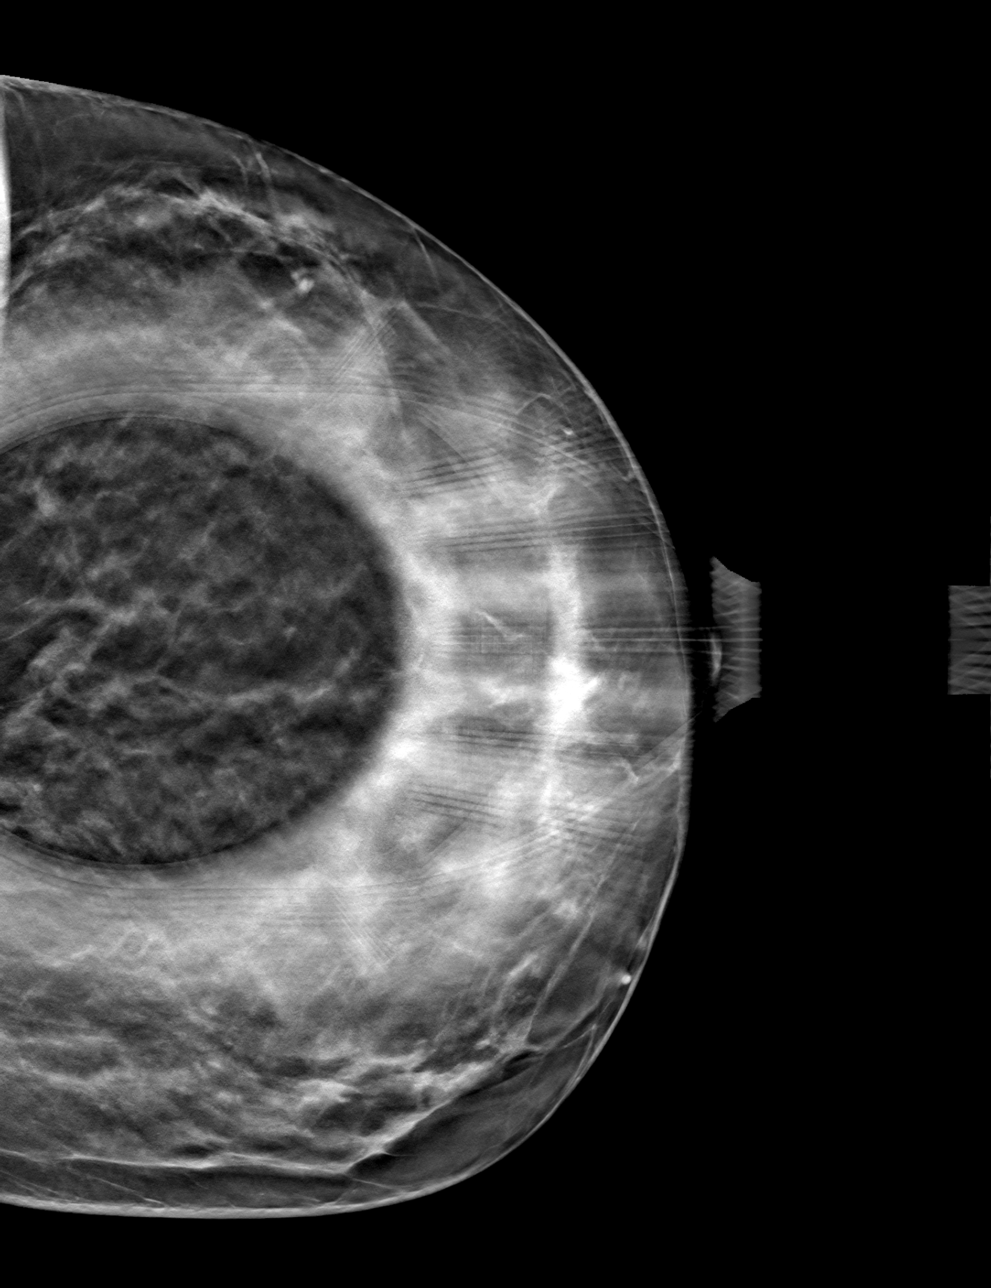

[L ML tomo · tomo slice 39/78.0]
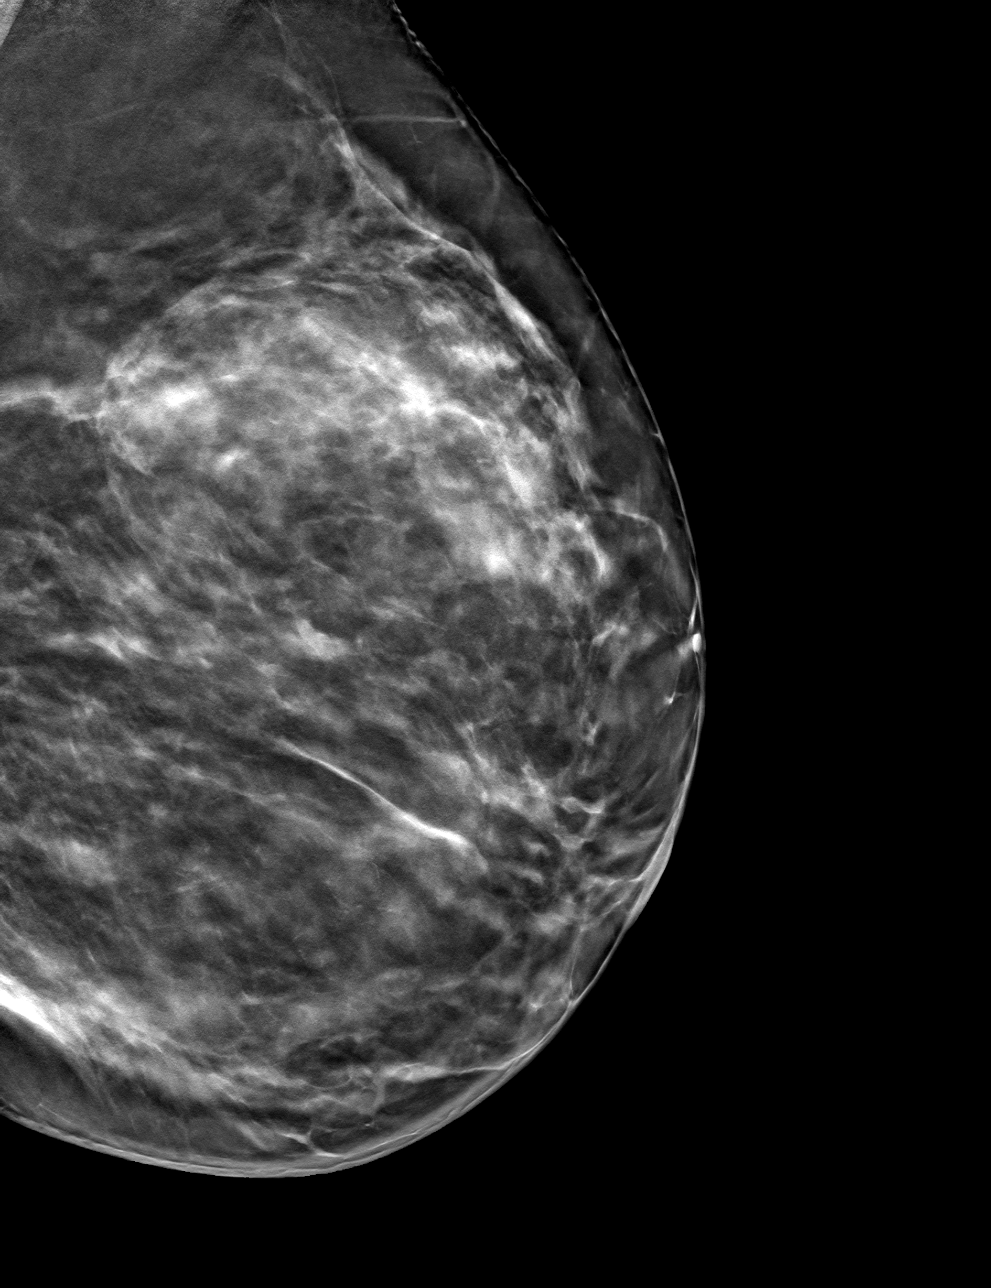

[4 of 12 positions shown; findings below may reference images not displayed]

ACR Breast Density Category c: The breast tissue is heterogeneously
dense, which may obscure small masses.
FINDINGS: The possible asymmetry, noted on the left breast screening cc view,
disperses on spot compression imaging consistent with superimposed
fibroglandular tissue. There is no underlying mass or significant
residual asymmetry. There is no architectural distortion and there
are no suspicious calcifications.

Mammographic images were processed with CAD.

Targeted ultrasound is performed, showing heterogeneous
fibroglandular tissue in the posterior left breast, with imaging
targeted to the central posterior left breast. No mass or suspicious
lesion.
IMPRESSION: Negative exam.  No evidence of breast malignancy.

RECOMMENDATION:
Screening mammogram in one year.(Code:[id number])

I have discussed the findings and recommendations with the patient.
If applicable, a reminder letter will be sent to the patient
regarding the next appointment.

BI-RADS CATEGORY  1: Negative.

## 2022-06-15 ENCOUNTER — Other Ambulatory Visit: Payer: Self-pay | Admitting: Obstetrics and Gynecology

## 2022-06-15 DIAGNOSIS — Z1231 Encounter for screening mammogram for malignant neoplasm of breast: Secondary | ICD-10-CM

## 2022-06-23 ENCOUNTER — Ambulatory Visit
Admission: RE | Admit: 2022-06-23 | Discharge: 2022-06-23 | Disposition: A | Discharge status: Home / Self Care | Payer: 59 | Source: Ambulatory Visit | Attending: Obstetrics and Gynecology | Admitting: Obstetrics and Gynecology

## 2022-06-23 DIAGNOSIS — Z1231 Encounter for screening mammogram for malignant neoplasm of breast: Secondary | ICD-10-CM

## 2022-08-22 ENCOUNTER — Ambulatory Visit (INDEPENDENT_AMBULATORY_CARE_PROVIDER_SITE_OTHER): Payer: 59 | Admitting: Obstetrics and Gynecology

## 2022-08-22 ENCOUNTER — Encounter: Payer: Self-pay | Admitting: Obstetrics and Gynecology

## 2022-08-22 ENCOUNTER — Other Ambulatory Visit (HOSPITAL_COMMUNITY)
Admission: RE | Admit: 2022-08-22 | Discharge: 2022-08-22 | Disposition: A | Discharge status: Home / Self Care | Payer: 59 | Source: Ambulatory Visit | Attending: Obstetrics and Gynecology | Admitting: Obstetrics and Gynecology

## 2022-08-22 VITALS — BP 124/80 | Ht 62.0 in | Wt 158.0 lb

## 2022-08-22 DIAGNOSIS — Z01419 Encounter for gynecological examination (general) (routine) without abnormal findings: Secondary | ICD-10-CM | POA: Diagnosis not present

## 2022-08-22 DIAGNOSIS — Z1211 Encounter for screening for malignant neoplasm of colon: Secondary | ICD-10-CM

## 2022-08-22 DIAGNOSIS — Z1272 Encounter for screening for malignant neoplasm of vagina: Secondary | ICD-10-CM | POA: Insufficient documentation

## 2022-08-22 NOTE — Patient Instructions (Signed)

## 2022-08-22 NOTE — Progress Notes (Signed)
46 y.o. G68P0010 Married Caucasian female here for annual exam.    Occasional hot flash.  No night sweats.   She will try to establish care with her husband's PCP.   PCP:   no PCP  Patient's last menstrual period was 10/15/2014 (approximate).           Sexually active: Yes.    The current method of family planning is status post hysterectomy.    Exercising:   Home exercise routine includes walking 1 hrs per day. Gym/ health club routine includes cardio. Smoker:  no  Health Maintenance: Pap:  11/15/18 - normal, negative HR HPV.  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 Colonoscopy:  never BMD:   n/a  Result  n/a TDaP:  09/29/2016 Gardasil:   no HIV: no Hep C: no Screening Labs: PCP Will do flu vaccine.  I discussed Covid vaccine.    reports that she has never smoked. She has never used smokeless tobacco. She reports that she does not drink alcohol and does not use drugs.  Past Medical History:  Diagnosis Date   Abnormal Pap smear 2006   HSIL (ASC-H) + HPV   Hyperlipidemia 2017   Low vitamin D level 2017    Past Surgical History:  Procedure Laterality Date   ABDOMINAL HYSTERECTOMY N/A 10/21/2014   Procedure: HYSTERECTOMY ABDOMINAL with bilateral salpingectomy;  Surgeon: Jamey Reas de Berton Lan, MD;  Location: [facility];  Service: Gynecology;  Laterality: N/A;   CERVICAL BIOPSY  W/ LOOP ELECTRODE EXCISION  06/03/05   HGSIL CIN II/CINIII   TONSILLECTOMY AND ADENOIDECTOMY     WISDOM TOOTH EXTRACTION      Current Outpatient Medications  Medication Sig Dispense Refill   Calcium-Magnesium-Vitamin D (CALCIUM 500 PO) Take by mouth daily.     Multiple Vitamin (MULTI-VITAMIN DAILY PO) Take by mouth daily.     No current facility-administered medications for this visit.    Family History  Problem Relation Age of Onset   Diabetes Mother        AODM   Thyroid disease  Mother        hypo   Cancer Father        carcinoma in sinus cavity   Prostate cancer Father    Multiple sclerosis Sister    Obesity Brother        prediabetes due to weight   Obesity Brother        heart issues due to diet/weight   Breast cancer Maternal Aunt        DX'd in 50's   Diabetes Maternal Aunt    Diabetes Maternal Aunt    Diabetes Maternal Aunt    Diabetes Maternal Aunt    Diabetes Maternal Uncle    Diabetes Maternal Grandmother        AODM    Review of Systems  See HPI  Exam:   BP 124/80 (BP Location: Right Arm, Patient Position: Sitting, Cuff Size: Normal)   Ht '5\' 2"'$  (1.575 m)   Wt 158 lb (71.7 kg)   LMP 10/15/2014 (Approximate)   SpO2 98%   BMI 28.90 kg/m     General appearance: alert, cooperative and appears stated age Head: normocephalic, without obvious abnormality, atraumatic Neck: no adenopathy, supple, symmetrical, trachea midline and thyroid normal to inspection and palpation Lungs: clear to auscultation bilaterally Breasts: normal appearance, no masses or tenderness,  No nipple retraction or dimpling, No nipple discharge or bleeding, No axillary adenopathy Heart: regular rate and rhythm Abdomen: soft, non-tender; no masses, no organomegaly Extremities: extremities normal, atraumatic, no cyanosis or edema Skin: skin color, texture, turgor normal. No rashes or lesions Lymph nodes: cervical, supraclavicular, and axillary nodes normal. Neurologic: grossly normal  Pelvic: External genitalia:  no lesions              No abnormal inguinal nodes palpated.              Urethra:  normal appearing urethra with no masses, tenderness or lesions              Bartholins and Skenes: normal                 Vagina: normal appearing vagina with normal color and discharge, no lesions              Cervix: absent              Pap taken: Yes.   Bimanual Exam:  Uterus:  absent              Adnexa: no mass, fullness, tenderness              Rectal exam: yes.   Confirms.              Anus:  normal sphincter tone, no lesions  Chaperone was present for exam:  Kimalexis, CMA.    Assessment:   Well woman visit with gynecologic exam. Status post total abdominal hysterectomy with bilateral salpingectomy. Hx prior LEEP for CIN III.  2006.  Cervix with normal histology at time of hysterectomy. Vaginal cancer screening.  Colon cancer screening.  Positive thyroid peroxidase.  Normal TFTs. FH thyroid disease. Unfavorable cholesterol ratios.  Plan: Mammogram screening discussed. Self breast awareness reviewed. Pap and HR HPV as above. Guidelines for Calcium, Vitamin D, regular exercise program including cardiovascular and weight bearing exercise. We reviewed signs and symptoms of menopause.  We discussed colonoscopy and Cologuard.  Cologuard ordered.  Labs with PCP.  Follow up annually and prn.   After visit summary provided.

## 2022-08-25 LAB — CYTOLOGY - PAP
Comment: NEGATIVE
Diagnosis: NEGATIVE
High risk HPV: NEGATIVE

## 2022-10-10 LAB — COLOGUARD: COLOGUARD: NEGATIVE

## 2023-08-24 ENCOUNTER — Ambulatory Visit: Payer: 59 | Admitting: Obstetrics and Gynecology

## 2023-09-25 ENCOUNTER — Other Ambulatory Visit: Payer: Self-pay | Admitting: Obstetrics and Gynecology

## 2023-09-25 DIAGNOSIS — Z1231 Encounter for screening mammogram for malignant neoplasm of breast: Secondary | ICD-10-CM

## 2023-10-24 ENCOUNTER — Ambulatory Visit
Admission: RE | Admit: 2023-10-24 | Discharge: 2023-10-24 | Disposition: A | Discharge status: Home / Self Care | Payer: Managed Care, Other (non HMO) | Source: Ambulatory Visit | Attending: Obstetrics and Gynecology | Admitting: Obstetrics and Gynecology

## 2023-10-24 DIAGNOSIS — Z1231 Encounter for screening mammogram for malignant neoplasm of breast: Secondary | ICD-10-CM

## 2023-12-05 NOTE — Progress Notes (Signed)
 48 y.o. G41P0010 Married Caucasian female here for annual exam.   No hot flashes.    PCP: Patient, No Pcp Per   Patient's last menstrual period was 10/15/2014 (approximate).           Sexually active: Yes.    The current method of family planning is status post hysterectomy.    Menopausal hormone therapy:  n/a Exercising: Yes.     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 She has never used smokeless tobacco. She reports that she does not drink alcohol and does not use drugs.  Past Medical History:  Diagnosis Date   Abnormal Pap smear 2006   HSIL (ASC-H) + HPV   Hyperlipidemia 2017   Low vitamin D  level 2017    Past Surgical History:  Procedure Laterality Date   ABDOMINAL HYSTERECTOMY N/A 10/21/2014   Procedure: HYSTERECTOMY ABDOMINAL with bilateral salpingectomy;  Surgeon: Bobie FORBES Crown de Charlynn FORBES Cary, MD;  Location: [facility];  Service: Gynecology;  Laterality: N/A;   CERVICAL BIOPSY  W/ LOOP ELECTRODE EXCISION  06/03/05   HGSIL CIN II/CINIII   TONSILLECTOMY AND ADENOIDECTOMY     WISDOM TOOTH EXTRACTION      Current Outpatient Medications  Medication Sig Dispense Refill   Calcium-Magnesium-Vitamin D  (CALCIUM 500 PO) Take by mouth daily.     Multiple Vitamin (MULTI-VITAMIN DAILY PO) Take by mouth daily.     No current  facility-administered medications for this visit.    ALLERGIES: Penicillins  Family History  Problem Relation Age of Onset   Diabetes Mother        AODM   Thyroid  disease Mother        hypo   Cancer Father        carcinoma in sinus cavity   Prostate cancer Father    Multiple sclerosis Sister    Obesity Brother        prediabetes due to weight   Obesity Brother        heart issues due to diet/weight   Breast cancer Maternal Aunt        DX'd in 50's   Diabetes Maternal Aunt    Diabetes Maternal Aunt    Diabetes Maternal Aunt    Diabetes Maternal Aunt    Diabetes Maternal Uncle    Diabetes Maternal Grandmother        AODM    Review of Systems  All other systems reviewed and are negative.   PHYSICAL EXAM:  BP 112/78   Pulse 72   Ht 5' 1.5 (1.562 m)   Wt 167 lb (75.8 kg)   LMP 10/15/2014 (Approximate)   SpO2 98%  BMI 31.04 kg/m     General appearance: alert, cooperative and appears stated age Head: normocephalic, without obvious abnormality, atraumatic Neck: no adenopathy, supple, symmetrical, trachea midline and thyroid  normal to inspection and palpation Lungs: clear to auscultation bilaterally Breasts: normal appearance, no masses or tenderness, No nipple retraction or dimpling, No nipple discharge or bleeding, No axillary adenopathy Heart: regular rate and rhythm Abdomen: soft, non-tender; no masses, no organomegaly Extremities: extremities normal, atraumatic, no cyanosis or edema Skin: skin color, texture, turgor normal. No rashes or lesions Lymph nodes: cervical, supraclavicular, and axillary nodes normal. Neurologic: grossly normal  Pelvic: External genitalia:  no lesions              No abnormal inguinal nodes palpated.              Urethra:  normal appearing urethra with no masses, tenderness or lesions              Bartholins and Skenes: normal                 Vagina: normal appearing vagina with normal color and discharge, no lesions               Cervix: absent              Pap taken: No. Bimanual Exam:  Uterus:  absent              Adnexa: no mass, fullness, tenderness              Rectal exam: Yes.  .  Confirms.              Anus:  normal sphincter tone, no lesions  Chaperone was present for exam:  Waddell MATSU, CMA  ASSESSMENT: Well woman visit with gynecologic exam Status post total abdominal hysterectomy with bilateral salpingectomy. Hx prior LEEP for CIN III.  2006.  Cervix with normal histology at time of hysterectomy. Positive thyroid  peroxidase.  Normal TFTs. FH thyroid  disease. PHQ2:  1  PLAN: Mammogram screening discussed. Self breast awareness reviewed. Pap and HRV collected:  No.  Due in 2026.  Guidelines for Calcium, Vitamin D , regular exercise program including cardiovascular and weight bearing exercise. Medication refills:  NA Routine labs today. We discussed options for PCPs.  Follow up:  yearly and prn.

## 2023-12-19 ENCOUNTER — Encounter: Payer: Self-pay | Admitting: Obstetrics and Gynecology

## 2023-12-19 ENCOUNTER — Ambulatory Visit (INDEPENDENT_AMBULATORY_CARE_PROVIDER_SITE_OTHER): Payer: Managed Care, Other (non HMO) | Admitting: Obstetrics and Gynecology

## 2023-12-19 VITALS — BP 112/78 | HR 72 | Ht 61.5 in | Wt 167.0 lb

## 2023-12-19 DIAGNOSIS — Z01419 Encounter for gynecological examination (general) (routine) without abnormal findings: Secondary | ICD-10-CM

## 2023-12-19 DIAGNOSIS — Z1331 Encounter for screening for depression: Secondary | ICD-10-CM | POA: Diagnosis not present

## 2023-12-19 DIAGNOSIS — Z0000 Encounter for general adult medical examination without abnormal findings: Secondary | ICD-10-CM

## 2023-12-19 NOTE — Patient Instructions (Signed)

## 2023-12-20 ENCOUNTER — Encounter: Payer: Self-pay | Admitting: Obstetrics and Gynecology

## 2023-12-20 ENCOUNTER — Other Ambulatory Visit: Payer: Self-pay | Admitting: Obstetrics and Gynecology

## 2023-12-20 DIAGNOSIS — D72829 Elevated white blood cell count, unspecified: Secondary | ICD-10-CM

## 2023-12-20 LAB — CBC
HCT: 40.8 % (ref 35.0–45.0)
Hemoglobin: 13.5 g/dL (ref 11.7–15.5)
MCH: 29.5 pg (ref 27.0–33.0)
MCHC: 33.1 g/dL (ref 32.0–36.0)
MCV: 89.3 fL (ref 80.0–100.0)
MPV: 9.6 fL (ref 7.5–12.5)
Platelets: 346 10*3/uL (ref 140–400)
RBC: 4.57 10*6/uL (ref 3.80–5.10)
RDW: 14 % (ref 11.0–15.0)
WBC: 11.5 10*3/uL — ABNORMAL HIGH (ref 3.8–10.8)

## 2023-12-20 LAB — COMPREHENSIVE METABOLIC PANEL
AG Ratio: 1.6 (calc) (ref 1.0–2.5)
ALT: 21 U/L (ref 6–29)
AST: 17 U/L (ref 10–35)
Albumin: 4.3 g/dL (ref 3.6–5.1)
Alkaline phosphatase (APISO): 76 U/L (ref 31–125)
BUN: 9 mg/dL (ref 7–25)
CO2: 24 mmol/L (ref 20–32)
Calcium: 8.9 mg/dL (ref 8.6–10.2)
Chloride: 103 mmol/L (ref 98–110)
Creat: 0.63 mg/dL (ref 0.50–0.99)
Globulin: 2.7 g/dL (ref 1.9–3.7)
Glucose, Bld: 85 mg/dL (ref 65–99)
Potassium: 3.8 mmol/L (ref 3.5–5.3)
Sodium: 137 mmol/L (ref 135–146)
Total Bilirubin: 0.6 mg/dL (ref 0.2–1.2)
Total Protein: 7 g/dL (ref 6.1–8.1)

## 2023-12-20 LAB — LIPID PANEL
Cholesterol: 203 mg/dL — ABNORMAL HIGH (ref <200)
HDL: 37 mg/dL — ABNORMAL LOW (ref >=50)
LDL Cholesterol (Calc): 141 mg/dL — ABNORMAL HIGH
Non-HDL Cholesterol (Calc): 166 mg/dL — ABNORMAL HIGH (ref <130)
Total CHOL/HDL Ratio: 5.5 (calc) — ABNORMAL HIGH (ref <5.0)
Triglycerides: 127 mg/dL (ref <150)

## 2023-12-20 LAB — T4, FREE: Free T4: 1 ng/dL (ref 0.8–1.8)

## 2023-12-20 LAB — TSH: TSH: 2.03 m[IU]/L

## 2024-02-22 ENCOUNTER — Encounter: Payer: Self-pay | Admitting: Obstetrics and Gynecology

## 2024-12-19 NOTE — Progress Notes (Unsigned)
 "  49 y.o. G26P0010 Married Caucasian female here for annual exam.    PCP: Patient, No Pcp Per   Patient's last menstrual period was 10/15/2014.           Sexually active: No.  The current method of family planning is status post hysterectomy.    Menopausal hormone therapy:  n/a Exercising: {yes no:314532}  {types:19826} Smoker:  no  OB History  Gravida Para Term Preterm AB Living  1 0 0 0 1 0  SAB IAB Ectopic Multiple Live Births  0 0 0 0 0    # Outcome Date GA Lbr Len/2nd Weight Sex Type Anes PTL Lv  1 AB              HEALTH MAINTENANCE: Last 2 paps:  08/22/22 neg, HR HPV neg, 11/15/18 neg, HPV neg  History of abnormal Pap or positive HPV:  no Mammogram:   10/24/23 Breast Density Cat C, BIRADS Cat 1 neg  Colonoscopy:  09/28/22 Cologuard neg  Bone Density:  n/a  Result  n/a   Immunization History  Administered Date(s) Administered   Influenza,inj,Quad PF,6+ Mos 10/22/2014   Tdap 09/19/2016      reports that she has never smoked. She has never used smokeless tobacco. She reports that she does not drink alcohol and does not use drugs.  Past Medical History:  Diagnosis Date   Abnormal Pap smear 2006   HSIL (ASC-H) + HPV   Hyperlipidemia 2017   Leukocytosis    Low vitamin D  level 2017    Past Surgical History:  Procedure Laterality Date   ABDOMINAL HYSTERECTOMY N/A 10/21/2014   Procedure: HYSTERECTOMY ABDOMINAL with bilateral salpingectomy;  Surgeon: Bobie FORBES Crown de Charlynn FORBES Cary, MD;  Location: [facility];  Service: Gynecology;  Laterality: N/A;   CERVICAL BIOPSY  W/ LOOP ELECTRODE EXCISION  06/03/05   HGSIL CIN II/CINIII   TONSILLECTOMY AND ADENOIDECTOMY     WISDOM TOOTH EXTRACTION      Current Outpatient Medications  Medication Sig Dispense Refill   Calcium-Magnesium-Vitamin D  (CALCIUM 500 PO) Take by mouth daily.     Multiple Vitamin (MULTI-VITAMIN DAILY PO) Take by mouth daily.     No current facility-administered medications for this visit.     ALLERGIES: Penicillins  Family History  Problem Relation Age of Onset   Diabetes Mother        AODM   Thyroid  disease Mother        hypo   Cancer Father        carcinoma in sinus cavity   Prostate cancer Father    Multiple sclerosis Sister    Obesity Brother        prediabetes due to weight   Obesity Brother        heart issues due to diet/weight   Breast cancer Maternal Aunt        DX'd in 50's   Diabetes Maternal Aunt    Diabetes Maternal Aunt    Diabetes Maternal Aunt    Diabetes Maternal Aunt    Diabetes Maternal Uncle    Diabetes Maternal Grandmother        AODM    Review of Systems  PHYSICAL EXAM:  LMP 10/15/2014     General appearance: alert, cooperative and appears stated age Head: normocephalic, without obvious abnormality, atraumatic Neck: no adenopathy, supple, symmetrical, trachea midline and thyroid  normal to inspection and palpation Lungs: clear to auscultation bilaterally Breasts: normal appearance, no masses or tenderness, No nipple retraction or  dimpling, No nipple discharge or bleeding, No axillary adenopathy Heart: regular rate and rhythm Abdomen: soft, non-tender; no masses, no organomegaly Extremities: extremities normal, atraumatic, no cyanosis or edema Skin: skin color, texture, turgor normal. No rashes or lesions Lymph nodes: cervical, supraclavicular, and axillary nodes normal. Neurologic: grossly normal  Pelvic: External genitalia:  no lesions              No abnormal inguinal nodes palpated.              Urethra:  normal appearing urethra with no masses, tenderness or lesions              Bartholins and Skenes: normal                 Vagina: normal appearing vagina with normal color and discharge, no lesions              Cervix: no lesions              Pap taken: {yes no:314532} Bimanual Exam:  Uterus:  normal size, contour, position, consistency, mobility, non-tender              Adnexa: no mass, fullness, tenderness               Rectal exam: {yes no:314532}.  Confirms.              Anus:  normal sphincter tone, no lesions  Chaperone was present for exam:  {BSCHAPERONE:31226::Emily F, CMA}  ASSESSMENT: Well woman visit with gynecologic exam.  PHQ-2-9: ***  ***  PLAN: Mammogram screening discussed. Self breast awareness reviewed. Pap and HRV collected:  {yes no:314532} Guidelines for Calcium, Vitamin D , regular exercise program including cardiovascular and weight bearing exercise. Medication refills:  *** {LABS (Optional):23779} Follow up:  ***    Additional counseling given.  {yes [id number]. ***  total time was spent for this patient encounter, including preparation, face-to-face counseling with the patient, coordination of care, and documentation of the encounter in addition to doing the well woman visit with gynecologic exam.        "

## 2024-12-23 ENCOUNTER — Ambulatory Visit: Payer: Managed Care, Other (non HMO) | Admitting: Obstetrics and Gynecology

## 2025-01-14 ENCOUNTER — Ambulatory Visit: Admitting: Family Medicine

## 2025-06-02 ENCOUNTER — Ambulatory Visit: Admitting: Obstetrics and Gynecology
